# Patient Record
Sex: Male | Born: 1935
Health system: Southern US, Community
[De-identification: ages and names within clinical notes are randomized; demographics above are authoritative.]

## PROBLEM LIST (undated history)

## (undated) DIAGNOSIS — M48061 Spinal stenosis, lumbar region without neurogenic claudication: Secondary | ICD-10-CM

## (undated) DIAGNOSIS — IMO0002 Reserved for concepts with insufficient information to code with codable children: Secondary | ICD-10-CM

## (undated) DIAGNOSIS — R609 Edema, unspecified: Secondary | ICD-10-CM

## (undated) DIAGNOSIS — M47817 Spondylosis without myelopathy or radiculopathy, lumbosacral region: Secondary | ICD-10-CM

## (undated) DIAGNOSIS — H3530 Unspecified macular degeneration: Secondary | ICD-10-CM

## (undated) DIAGNOSIS — E119 Type 2 diabetes mellitus without complications: Secondary | ICD-10-CM

## (undated) DIAGNOSIS — E785 Hyperlipidemia, unspecified: Secondary | ICD-10-CM

## (undated) DIAGNOSIS — R600 Localized edema: Secondary | ICD-10-CM

## (undated) DIAGNOSIS — I639 Cerebral infarction, unspecified: Secondary | ICD-10-CM

## (undated) DIAGNOSIS — R2681 Unsteadiness on feet: Secondary | ICD-10-CM

## (undated) DIAGNOSIS — I10 Essential (primary) hypertension: Secondary | ICD-10-CM

## (undated) DIAGNOSIS — G629 Polyneuropathy, unspecified: Secondary | ICD-10-CM

## (undated) DIAGNOSIS — H409 Unspecified glaucoma: Secondary | ICD-10-CM

## (undated) HISTORY — DX: Unspecified glaucoma: H40.9

## (undated) HISTORY — DX: Edema, unspecified: R60.9

## (undated) HISTORY — DX: Spondylosis without myelopathy or radiculopathy, lumbosacral region: M47.817

## (undated) HISTORY — DX: Type 2 diabetes mellitus without complications: E11.9

## (undated) HISTORY — DX: Essential (primary) hypertension: I10

## (undated) HISTORY — PX: TONSILLECTOMY: SUR1361

## (undated) HISTORY — PX: CATARACT EXTRACTION: SUR2

## (undated) HISTORY — DX: Reserved for concepts with insufficient information to code with codable children: IMO0002

## (undated) HISTORY — DX: Polyneuropathy, unspecified: G62.9

## (undated) HISTORY — PX: HERNIA REPAIR: SHX51

## (undated) HISTORY — DX: Unsteadiness on feet: R26.81

## (undated) HISTORY — DX: Hyperlipidemia, unspecified: E78.5

## (undated) HISTORY — DX: Unspecified macular degeneration: H35.30

## (undated) HISTORY — DX: Spinal stenosis, lumbar region without neurogenic claudication: M48.061

## (undated) HISTORY — PX: CHOLECYSTECTOMY: SHX55

## (undated) HISTORY — DX: Cerebral infarction, unspecified: I63.9

## (undated) HISTORY — DX: Localized edema: R60.0

---

## 2010-11-19 ENCOUNTER — Emergency Department (HOSPITAL_COMMUNITY)
Admission: EM | Admit: 2010-11-19 | Discharge: 2010-11-19 | Disposition: A | Discharge status: Home / Self Care | Payer: Medicare Other | Attending: Emergency Medicine | Admitting: Emergency Medicine

## 2010-11-19 DIAGNOSIS — N3090 Cystitis, unspecified without hematuria: Secondary | ICD-10-CM | POA: Insufficient documentation

## 2010-11-19 DIAGNOSIS — R319 Hematuria, unspecified: Secondary | ICD-10-CM | POA: Insufficient documentation

## 2010-11-19 LAB — URINALYSIS, ROUTINE W REFLEX MICROSCOPIC
Bilirubin Urine: NEGATIVE
Ketones, ur: NEGATIVE mg/dL
Nitrite: NEGATIVE
Protein, ur: NEGATIVE mg/dL
Specific Gravity, Urine: 1.021 (ref 1.005–1.030)
Urobilinogen, UA: 1 mg/dL (ref 0.0–1.0)

## 2010-11-21 LAB — URINE CULTURE
Colony Count: >=100000
Culture  Setup Time: 201203251201

## 2011-10-12 DIAGNOSIS — Z79899 Other long term (current) drug therapy: Secondary | ICD-10-CM | POA: Diagnosis not present

## 2011-10-12 DIAGNOSIS — I6990 Unspecified sequelae of unspecified cerebrovascular disease: Secondary | ICD-10-CM | POA: Diagnosis not present

## 2011-10-12 DIAGNOSIS — I10 Essential (primary) hypertension: Secondary | ICD-10-CM | POA: Diagnosis not present

## 2011-10-12 DIAGNOSIS — G609 Hereditary and idiopathic neuropathy, unspecified: Secondary | ICD-10-CM | POA: Diagnosis not present

## 2011-10-12 DIAGNOSIS — R609 Edema, unspecified: Secondary | ICD-10-CM | POA: Diagnosis not present

## 2011-10-12 DIAGNOSIS — E785 Hyperlipidemia, unspecified: Secondary | ICD-10-CM | POA: Diagnosis not present

## 2011-10-12 DIAGNOSIS — N400 Enlarged prostate without lower urinary tract symptoms: Secondary | ICD-10-CM | POA: Diagnosis not present

## 2011-10-12 DIAGNOSIS — E119 Type 2 diabetes mellitus without complications: Secondary | ICD-10-CM | POA: Diagnosis not present

## 2011-10-18 DIAGNOSIS — I10 Essential (primary) hypertension: Secondary | ICD-10-CM | POA: Diagnosis not present

## 2011-10-18 DIAGNOSIS — R609 Edema, unspecified: Secondary | ICD-10-CM | POA: Diagnosis not present

## 2011-10-31 DIAGNOSIS — R609 Edema, unspecified: Secondary | ICD-10-CM | POA: Diagnosis not present

## 2011-10-31 DIAGNOSIS — E119 Type 2 diabetes mellitus without complications: Secondary | ICD-10-CM | POA: Diagnosis not present

## 2011-10-31 DIAGNOSIS — I10 Essential (primary) hypertension: Secondary | ICD-10-CM | POA: Diagnosis not present

## 2011-10-31 DIAGNOSIS — N400 Enlarged prostate without lower urinary tract symptoms: Secondary | ICD-10-CM | POA: Diagnosis not present

## 2011-10-31 DIAGNOSIS — G609 Hereditary and idiopathic neuropathy, unspecified: Secondary | ICD-10-CM | POA: Diagnosis not present

## 2011-11-07 DIAGNOSIS — H3530 Unspecified macular degeneration: Secondary | ICD-10-CM | POA: Diagnosis not present

## 2011-11-07 DIAGNOSIS — H409 Unspecified glaucoma: Secondary | ICD-10-CM | POA: Diagnosis not present

## 2011-11-07 DIAGNOSIS — Z961 Presence of intraocular lens: Secondary | ICD-10-CM | POA: Diagnosis not present

## 2011-11-07 DIAGNOSIS — H4010X0 Unspecified open-angle glaucoma, stage unspecified: Secondary | ICD-10-CM | POA: Diagnosis not present

## 2011-11-08 DIAGNOSIS — G63 Polyneuropathy in diseases classified elsewhere: Secondary | ICD-10-CM | POA: Diagnosis not present

## 2011-11-08 DIAGNOSIS — R209 Unspecified disturbances of skin sensation: Secondary | ICD-10-CM | POA: Diagnosis not present

## 2011-11-08 DIAGNOSIS — R269 Unspecified abnormalities of gait and mobility: Secondary | ICD-10-CM | POA: Diagnosis not present

## 2011-11-16 DIAGNOSIS — D1801 Hemangioma of skin and subcutaneous tissue: Secondary | ICD-10-CM | POA: Diagnosis not present

## 2011-11-16 DIAGNOSIS — L821 Other seborrheic keratosis: Secondary | ICD-10-CM | POA: Diagnosis not present

## 2011-11-16 DIAGNOSIS — L570 Actinic keratosis: Secondary | ICD-10-CM | POA: Diagnosis not present

## 2011-11-16 DIAGNOSIS — L819 Disorder of pigmentation, unspecified: Secondary | ICD-10-CM | POA: Diagnosis not present

## 2011-12-19 DIAGNOSIS — I6990 Unspecified sequelae of unspecified cerebrovascular disease: Secondary | ICD-10-CM | POA: Diagnosis not present

## 2011-12-19 DIAGNOSIS — I10 Essential (primary) hypertension: Secondary | ICD-10-CM | POA: Diagnosis not present

## 2011-12-19 DIAGNOSIS — E119 Type 2 diabetes mellitus without complications: Secondary | ICD-10-CM | POA: Diagnosis not present

## 2011-12-19 DIAGNOSIS — R609 Edema, unspecified: Secondary | ICD-10-CM | POA: Diagnosis not present

## 2011-12-19 DIAGNOSIS — E785 Hyperlipidemia, unspecified: Secondary | ICD-10-CM | POA: Diagnosis not present

## 2011-12-19 DIAGNOSIS — G609 Hereditary and idiopathic neuropathy, unspecified: Secondary | ICD-10-CM | POA: Diagnosis not present

## 2011-12-19 DIAGNOSIS — Z79899 Other long term (current) drug therapy: Secondary | ICD-10-CM | POA: Diagnosis not present

## 2012-01-17 DIAGNOSIS — N3020 Other chronic cystitis without hematuria: Secondary | ICD-10-CM | POA: Diagnosis not present

## 2012-01-17 DIAGNOSIS — N138 Other obstructive and reflux uropathy: Secondary | ICD-10-CM | POA: Diagnosis not present

## 2012-01-18 DIAGNOSIS — L819 Disorder of pigmentation, unspecified: Secondary | ICD-10-CM | POA: Diagnosis not present

## 2012-01-18 DIAGNOSIS — D1801 Hemangioma of skin and subcutaneous tissue: Secondary | ICD-10-CM | POA: Diagnosis not present

## 2012-01-18 DIAGNOSIS — L821 Other seborrheic keratosis: Secondary | ICD-10-CM | POA: Diagnosis not present

## 2012-01-18 DIAGNOSIS — D235 Other benign neoplasm of skin of trunk: Secondary | ICD-10-CM | POA: Diagnosis not present

## 2012-02-21 DIAGNOSIS — R269 Unspecified abnormalities of gait and mobility: Secondary | ICD-10-CM | POA: Diagnosis not present

## 2012-02-21 DIAGNOSIS — G63 Polyneuropathy in diseases classified elsewhere: Secondary | ICD-10-CM | POA: Diagnosis not present

## 2012-02-21 DIAGNOSIS — R209 Unspecified disturbances of skin sensation: Secondary | ICD-10-CM | POA: Diagnosis not present

## 2012-02-23 ENCOUNTER — Other Ambulatory Visit: Payer: Self-pay | Admitting: Neurology

## 2012-02-23 DIAGNOSIS — R209 Unspecified disturbances of skin sensation: Secondary | ICD-10-CM

## 2012-02-23 DIAGNOSIS — G63 Polyneuropathy in diseases classified elsewhere: Secondary | ICD-10-CM

## 2012-02-23 DIAGNOSIS — R269 Unspecified abnormalities of gait and mobility: Secondary | ICD-10-CM

## 2012-03-04 ENCOUNTER — Ambulatory Visit
Admission: RE | Admit: 2012-03-04 | Discharge: 2012-03-04 | Disposition: A | Discharge status: Home / Self Care | Payer: Medicare Other | Source: Ambulatory Visit | Attending: Neurology | Admitting: Neurology

## 2012-03-04 DIAGNOSIS — G63 Polyneuropathy in diseases classified elsewhere: Secondary | ICD-10-CM

## 2012-03-04 DIAGNOSIS — R209 Unspecified disturbances of skin sensation: Secondary | ICD-10-CM | POA: Diagnosis not present

## 2012-03-04 DIAGNOSIS — R269 Unspecified abnormalities of gait and mobility: Secondary | ICD-10-CM | POA: Diagnosis not present

## 2012-05-21 DIAGNOSIS — H4010X0 Unspecified open-angle glaucoma, stage unspecified: Secondary | ICD-10-CM | POA: Diagnosis not present

## 2012-05-21 DIAGNOSIS — H409 Unspecified glaucoma: Secondary | ICD-10-CM | POA: Diagnosis not present

## 2012-05-21 DIAGNOSIS — Z961 Presence of intraocular lens: Secondary | ICD-10-CM | POA: Diagnosis not present

## 2012-05-23 DIAGNOSIS — Z79899 Other long term (current) drug therapy: Secondary | ICD-10-CM | POA: Diagnosis not present

## 2012-05-23 DIAGNOSIS — E785 Hyperlipidemia, unspecified: Secondary | ICD-10-CM | POA: Diagnosis not present

## 2012-05-23 DIAGNOSIS — R609 Edema, unspecified: Secondary | ICD-10-CM | POA: Diagnosis not present

## 2012-05-23 DIAGNOSIS — G609 Hereditary and idiopathic neuropathy, unspecified: Secondary | ICD-10-CM | POA: Diagnosis not present

## 2012-05-23 DIAGNOSIS — I6990 Unspecified sequelae of unspecified cerebrovascular disease: Secondary | ICD-10-CM | POA: Diagnosis not present

## 2012-05-23 DIAGNOSIS — E119 Type 2 diabetes mellitus without complications: Secondary | ICD-10-CM | POA: Diagnosis not present

## 2012-05-23 DIAGNOSIS — I10 Essential (primary) hypertension: Secondary | ICD-10-CM | POA: Diagnosis not present

## 2012-05-23 DIAGNOSIS — N400 Enlarged prostate without lower urinary tract symptoms: Secondary | ICD-10-CM | POA: Diagnosis not present

## 2012-06-06 DIAGNOSIS — Z23 Encounter for immunization: Secondary | ICD-10-CM | POA: Diagnosis not present

## 2012-06-20 DIAGNOSIS — R609 Edema, unspecified: Secondary | ICD-10-CM | POA: Diagnosis not present

## 2012-06-20 DIAGNOSIS — I10 Essential (primary) hypertension: Secondary | ICD-10-CM | POA: Diagnosis not present

## 2012-06-20 DIAGNOSIS — E785 Hyperlipidemia, unspecified: Secondary | ICD-10-CM | POA: Diagnosis not present

## 2012-06-21 DIAGNOSIS — R269 Unspecified abnormalities of gait and mobility: Secondary | ICD-10-CM | POA: Diagnosis not present

## 2012-06-21 DIAGNOSIS — G63 Polyneuropathy in diseases classified elsewhere: Secondary | ICD-10-CM | POA: Diagnosis not present

## 2012-06-21 DIAGNOSIS — R209 Unspecified disturbances of skin sensation: Secondary | ICD-10-CM | POA: Diagnosis not present

## 2012-07-18 DIAGNOSIS — N323 Diverticulum of bladder: Secondary | ICD-10-CM | POA: Diagnosis not present

## 2012-07-18 DIAGNOSIS — N3020 Other chronic cystitis without hematuria: Secondary | ICD-10-CM | POA: Diagnosis not present

## 2012-07-18 DIAGNOSIS — N403 Nodular prostate with lower urinary tract symptoms: Secondary | ICD-10-CM | POA: Diagnosis not present

## 2012-07-18 DIAGNOSIS — N138 Other obstructive and reflux uropathy: Secondary | ICD-10-CM | POA: Diagnosis not present

## 2012-07-31 DIAGNOSIS — N183 Chronic kidney disease, stage 3 unspecified: Secondary | ICD-10-CM | POA: Diagnosis not present

## 2012-07-31 DIAGNOSIS — D638 Anemia in other chronic diseases classified elsewhere: Secondary | ICD-10-CM | POA: Diagnosis not present

## 2012-08-20 DIAGNOSIS — Z96659 Presence of unspecified artificial knee joint: Secondary | ICD-10-CM | POA: Diagnosis not present

## 2012-08-20 DIAGNOSIS — I129 Hypertensive chronic kidney disease with stage 1 through stage 4 chronic kidney disease, or unspecified chronic kidney disease: Secondary | ICD-10-CM | POA: Diagnosis present

## 2012-08-20 DIAGNOSIS — I739 Peripheral vascular disease, unspecified: Secondary | ICD-10-CM | POA: Diagnosis present

## 2012-08-20 DIAGNOSIS — I498 Other specified cardiac arrhythmias: Secondary | ICD-10-CM | POA: Diagnosis present

## 2012-08-20 DIAGNOSIS — J189 Pneumonia, unspecified organism: Secondary | ICD-10-CM | POA: Diagnosis present

## 2012-08-20 DIAGNOSIS — N189 Chronic kidney disease, unspecified: Secondary | ICD-10-CM | POA: Diagnosis present

## 2012-08-20 DIAGNOSIS — E119 Type 2 diabetes mellitus without complications: Secondary | ICD-10-CM | POA: Diagnosis present

## 2012-08-20 DIAGNOSIS — N179 Acute kidney failure, unspecified: Secondary | ICD-10-CM | POA: Diagnosis present

## 2012-08-20 DIAGNOSIS — I214 Non-ST elevation (NSTEMI) myocardial infarction: Secondary | ICD-10-CM | POA: Diagnosis present

## 2012-08-20 DIAGNOSIS — J9600 Acute respiratory failure, unspecified whether with hypoxia or hypercapnia: Secondary | ICD-10-CM | POA: Diagnosis present

## 2012-08-20 DIAGNOSIS — I2510 Atherosclerotic heart disease of native coronary artery without angina pectoris: Secondary | ICD-10-CM | POA: Diagnosis present

## 2012-08-20 DIAGNOSIS — N390 Urinary tract infection, site not specified: Secondary | ICD-10-CM | POA: Diagnosis present

## 2012-08-20 DIAGNOSIS — Z79899 Other long term (current) drug therapy: Secondary | ICD-10-CM | POA: Diagnosis not present

## 2012-08-20 DIAGNOSIS — E871 Hypo-osmolality and hyponatremia: Secondary | ICD-10-CM | POA: Diagnosis present

## 2012-08-20 DIAGNOSIS — A498 Other bacterial infections of unspecified site: Secondary | ICD-10-CM | POA: Diagnosis present

## 2012-08-20 DIAGNOSIS — I509 Heart failure, unspecified: Secondary | ICD-10-CM | POA: Diagnosis present

## 2012-08-20 DIAGNOSIS — I5031 Acute diastolic (congestive) heart failure: Secondary | ICD-10-CM | POA: Diagnosis present

## 2012-09-03 DIAGNOSIS — D638 Anemia in other chronic diseases classified elsewhere: Secondary | ICD-10-CM | POA: Diagnosis not present

## 2012-09-03 DIAGNOSIS — N183 Chronic kidney disease, stage 3 unspecified: Secondary | ICD-10-CM | POA: Diagnosis not present

## 2012-10-01 DIAGNOSIS — N183 Chronic kidney disease, stage 3 unspecified: Secondary | ICD-10-CM | POA: Diagnosis not present

## 2012-10-01 DIAGNOSIS — D638 Anemia in other chronic diseases classified elsewhere: Secondary | ICD-10-CM | POA: Diagnosis not present

## 2012-10-08 DIAGNOSIS — D638 Anemia in other chronic diseases classified elsewhere: Secondary | ICD-10-CM | POA: Diagnosis not present

## 2012-10-08 DIAGNOSIS — N183 Chronic kidney disease, stage 3 unspecified: Secondary | ICD-10-CM | POA: Diagnosis not present

## 2012-10-16 DIAGNOSIS — N183 Chronic kidney disease, stage 3 unspecified: Secondary | ICD-10-CM | POA: Diagnosis not present

## 2012-10-16 DIAGNOSIS — D638 Anemia in other chronic diseases classified elsewhere: Secondary | ICD-10-CM | POA: Diagnosis not present

## 2012-10-17 DIAGNOSIS — E119 Type 2 diabetes mellitus without complications: Secondary | ICD-10-CM | POA: Diagnosis not present

## 2012-10-17 DIAGNOSIS — I10 Essential (primary) hypertension: Secondary | ICD-10-CM | POA: Diagnosis not present

## 2012-10-17 DIAGNOSIS — R609 Edema, unspecified: Secondary | ICD-10-CM | POA: Diagnosis not present

## 2012-10-17 DIAGNOSIS — E785 Hyperlipidemia, unspecified: Secondary | ICD-10-CM | POA: Diagnosis not present

## 2012-10-17 DIAGNOSIS — G609 Hereditary and idiopathic neuropathy, unspecified: Secondary | ICD-10-CM | POA: Diagnosis not present

## 2012-10-17 DIAGNOSIS — N400 Enlarged prostate without lower urinary tract symptoms: Secondary | ICD-10-CM | POA: Diagnosis not present

## 2012-10-17 DIAGNOSIS — Z79899 Other long term (current) drug therapy: Secondary | ICD-10-CM | POA: Diagnosis not present

## 2012-10-17 DIAGNOSIS — I6990 Unspecified sequelae of unspecified cerebrovascular disease: Secondary | ICD-10-CM | POA: Diagnosis not present

## 2012-10-22 DIAGNOSIS — R269 Unspecified abnormalities of gait and mobility: Secondary | ICD-10-CM | POA: Diagnosis not present

## 2012-10-22 DIAGNOSIS — G63 Polyneuropathy in diseases classified elsewhere: Secondary | ICD-10-CM | POA: Diagnosis not present

## 2012-10-22 DIAGNOSIS — R209 Unspecified disturbances of skin sensation: Secondary | ICD-10-CM | POA: Diagnosis not present

## 2012-10-23 DIAGNOSIS — M5137 Other intervertebral disc degeneration, lumbosacral region: Secondary | ICD-10-CM | POA: Diagnosis not present

## 2012-10-23 DIAGNOSIS — M25559 Pain in unspecified hip: Secondary | ICD-10-CM | POA: Diagnosis not present

## 2012-10-23 DIAGNOSIS — M999 Biomechanical lesion, unspecified: Secondary | ICD-10-CM | POA: Diagnosis not present

## 2012-10-24 DIAGNOSIS — M5137 Other intervertebral disc degeneration, lumbosacral region: Secondary | ICD-10-CM | POA: Diagnosis not present

## 2012-10-24 DIAGNOSIS — M999 Biomechanical lesion, unspecified: Secondary | ICD-10-CM | POA: Diagnosis not present

## 2012-10-24 DIAGNOSIS — IMO0002 Reserved for concepts with insufficient information to code with codable children: Secondary | ICD-10-CM | POA: Diagnosis not present

## 2012-10-28 DIAGNOSIS — IMO0002 Reserved for concepts with insufficient information to code with codable children: Secondary | ICD-10-CM | POA: Diagnosis not present

## 2012-10-28 DIAGNOSIS — M5137 Other intervertebral disc degeneration, lumbosacral region: Secondary | ICD-10-CM | POA: Diagnosis not present

## 2012-10-28 DIAGNOSIS — M999 Biomechanical lesion, unspecified: Secondary | ICD-10-CM | POA: Diagnosis not present

## 2012-10-30 DIAGNOSIS — N183 Chronic kidney disease, stage 3 unspecified: Secondary | ICD-10-CM | POA: Diagnosis not present

## 2012-10-30 DIAGNOSIS — D638 Anemia in other chronic diseases classified elsewhere: Secondary | ICD-10-CM | POA: Diagnosis not present

## 2012-11-26 DIAGNOSIS — N183 Chronic kidney disease, stage 3 unspecified: Secondary | ICD-10-CM | POA: Diagnosis not present

## 2012-11-26 DIAGNOSIS — D638 Anemia in other chronic diseases classified elsewhere: Secondary | ICD-10-CM | POA: Diagnosis not present

## 2012-12-10 DIAGNOSIS — E785 Hyperlipidemia, unspecified: Secondary | ICD-10-CM | POA: Diagnosis not present

## 2012-12-25 DIAGNOSIS — Z79899 Other long term (current) drug therapy: Secondary | ICD-10-CM | POA: Diagnosis not present

## 2012-12-25 DIAGNOSIS — I6990 Unspecified sequelae of unspecified cerebrovascular disease: Secondary | ICD-10-CM | POA: Diagnosis not present

## 2012-12-25 DIAGNOSIS — Z9861 Coronary angioplasty status: Secondary | ICD-10-CM | POA: Diagnosis not present

## 2012-12-25 DIAGNOSIS — Z8701 Personal history of pneumonia (recurrent): Secondary | ICD-10-CM | POA: Diagnosis not present

## 2012-12-25 DIAGNOSIS — I739 Peripheral vascular disease, unspecified: Secondary | ICD-10-CM | POA: Diagnosis not present

## 2012-12-25 DIAGNOSIS — I10 Essential (primary) hypertension: Secondary | ICD-10-CM | POA: Diagnosis not present

## 2012-12-25 DIAGNOSIS — E785 Hyperlipidemia, unspecified: Secondary | ICD-10-CM | POA: Diagnosis not present

## 2012-12-25 DIAGNOSIS — Z8719 Personal history of other diseases of the digestive system: Secondary | ICD-10-CM | POA: Diagnosis not present

## 2012-12-25 DIAGNOSIS — Z7982 Long term (current) use of aspirin: Secondary | ICD-10-CM | POA: Diagnosis not present

## 2012-12-25 DIAGNOSIS — R42 Dizziness and giddiness: Secondary | ICD-10-CM | POA: Diagnosis not present

## 2012-12-25 DIAGNOSIS — E119 Type 2 diabetes mellitus without complications: Secondary | ICD-10-CM | POA: Diagnosis not present

## 2012-12-25 DIAGNOSIS — Z8679 Personal history of other diseases of the circulatory system: Secondary | ICD-10-CM | POA: Diagnosis not present

## 2013-01-01 DIAGNOSIS — D638 Anemia in other chronic diseases classified elsewhere: Secondary | ICD-10-CM | POA: Diagnosis not present

## 2013-01-01 DIAGNOSIS — N183 Chronic kidney disease, stage 3 unspecified: Secondary | ICD-10-CM | POA: Diagnosis not present

## 2013-01-15 DIAGNOSIS — N183 Chronic kidney disease, stage 3 unspecified: Secondary | ICD-10-CM | POA: Diagnosis not present

## 2013-01-15 DIAGNOSIS — D638 Anemia in other chronic diseases classified elsewhere: Secondary | ICD-10-CM | POA: Diagnosis not present

## 2013-01-16 DIAGNOSIS — D1801 Hemangioma of skin and subcutaneous tissue: Secondary | ICD-10-CM | POA: Diagnosis not present

## 2013-01-16 DIAGNOSIS — D485 Neoplasm of uncertain behavior of skin: Secondary | ICD-10-CM | POA: Diagnosis not present

## 2013-01-16 DIAGNOSIS — L570 Actinic keratosis: Secondary | ICD-10-CM | POA: Diagnosis not present

## 2013-01-16 DIAGNOSIS — L821 Other seborrheic keratosis: Secondary | ICD-10-CM | POA: Diagnosis not present

## 2013-01-22 DIAGNOSIS — N183 Chronic kidney disease, stage 3 unspecified: Secondary | ICD-10-CM | POA: Diagnosis not present

## 2013-01-22 DIAGNOSIS — D638 Anemia in other chronic diseases classified elsewhere: Secondary | ICD-10-CM | POA: Diagnosis not present

## 2013-01-29 DIAGNOSIS — N183 Chronic kidney disease, stage 3 unspecified: Secondary | ICD-10-CM | POA: Diagnosis not present

## 2013-01-29 DIAGNOSIS — D638 Anemia in other chronic diseases classified elsewhere: Secondary | ICD-10-CM | POA: Diagnosis not present

## 2013-02-20 DIAGNOSIS — L905 Scar conditions and fibrosis of skin: Secondary | ICD-10-CM | POA: Diagnosis not present

## 2013-02-20 DIAGNOSIS — D485 Neoplasm of uncertain behavior of skin: Secondary | ICD-10-CM | POA: Diagnosis not present

## 2013-02-25 DIAGNOSIS — H409 Unspecified glaucoma: Secondary | ICD-10-CM | POA: Diagnosis not present

## 2013-02-25 DIAGNOSIS — H4010X0 Unspecified open-angle glaucoma, stage unspecified: Secondary | ICD-10-CM | POA: Diagnosis not present

## 2013-02-25 DIAGNOSIS — H04129 Dry eye syndrome of unspecified lacrimal gland: Secondary | ICD-10-CM | POA: Diagnosis not present

## 2013-02-25 DIAGNOSIS — H3530 Unspecified macular degeneration: Secondary | ICD-10-CM | POA: Diagnosis not present

## 2013-02-26 DIAGNOSIS — N183 Chronic kidney disease, stage 3 unspecified: Secondary | ICD-10-CM | POA: Diagnosis not present

## 2013-02-26 DIAGNOSIS — D638 Anemia in other chronic diseases classified elsewhere: Secondary | ICD-10-CM | POA: Diagnosis not present

## 2013-03-21 ENCOUNTER — Ambulatory Visit: Payer: Self-pay | Admitting: Neurology

## 2013-04-17 DIAGNOSIS — I10 Essential (primary) hypertension: Secondary | ICD-10-CM | POA: Diagnosis not present

## 2013-04-17 DIAGNOSIS — Z79899 Other long term (current) drug therapy: Secondary | ICD-10-CM | POA: Diagnosis not present

## 2013-04-17 DIAGNOSIS — E119 Type 2 diabetes mellitus without complications: Secondary | ICD-10-CM | POA: Diagnosis not present

## 2013-04-17 DIAGNOSIS — R269 Unspecified abnormalities of gait and mobility: Secondary | ICD-10-CM | POA: Diagnosis not present

## 2013-04-17 DIAGNOSIS — N400 Enlarged prostate without lower urinary tract symptoms: Secondary | ICD-10-CM | POA: Diagnosis not present

## 2013-04-17 DIAGNOSIS — R0609 Other forms of dyspnea: Secondary | ICD-10-CM | POA: Diagnosis not present

## 2013-04-17 DIAGNOSIS — G609 Hereditary and idiopathic neuropathy, unspecified: Secondary | ICD-10-CM | POA: Diagnosis not present

## 2013-04-17 DIAGNOSIS — R609 Edema, unspecified: Secondary | ICD-10-CM | POA: Diagnosis not present

## 2013-05-05 DIAGNOSIS — R29898 Other symptoms and signs involving the musculoskeletal system: Secondary | ICD-10-CM | POA: Diagnosis not present

## 2013-05-05 DIAGNOSIS — G609 Hereditary and idiopathic neuropathy, unspecified: Secondary | ICD-10-CM | POA: Diagnosis not present

## 2013-05-08 ENCOUNTER — Ambulatory Visit: Payer: Medicare Other | Attending: Family Medicine

## 2013-05-08 DIAGNOSIS — IMO0001 Reserved for inherently not codable concepts without codable children: Secondary | ICD-10-CM | POA: Insufficient documentation

## 2013-05-08 DIAGNOSIS — R262 Difficulty in walking, not elsewhere classified: Secondary | ICD-10-CM | POA: Diagnosis not present

## 2013-05-08 DIAGNOSIS — R5381 Other malaise: Secondary | ICD-10-CM | POA: Diagnosis not present

## 2013-05-08 DIAGNOSIS — M6281 Muscle weakness (generalized): Secondary | ICD-10-CM | POA: Insufficient documentation

## 2013-05-09 ENCOUNTER — Ambulatory Visit: Payer: Medicare Other | Admitting: Physical Therapy

## 2013-05-13 ENCOUNTER — Ambulatory Visit
Admission: RE | Admit: 2013-05-13 | Discharge: 2013-05-13 | Disposition: A | Discharge status: Home / Self Care | Payer: Medicare Other | Source: Ambulatory Visit | Attending: Family Medicine | Admitting: Family Medicine

## 2013-05-13 ENCOUNTER — Other Ambulatory Visit: Payer: Self-pay | Admitting: Lactation Services

## 2013-05-13 ENCOUNTER — Other Ambulatory Visit: Payer: Self-pay | Admitting: Family Medicine

## 2013-05-13 DIAGNOSIS — R609 Edema, unspecified: Secondary | ICD-10-CM | POA: Diagnosis not present

## 2013-05-13 DIAGNOSIS — S298XXA Other specified injuries of thorax, initial encounter: Secondary | ICD-10-CM | POA: Diagnosis not present

## 2013-05-13 DIAGNOSIS — R0989 Other specified symptoms and signs involving the circulatory and respiratory systems: Secondary | ICD-10-CM

## 2013-05-13 DIAGNOSIS — R059 Cough, unspecified: Secondary | ICD-10-CM | POA: Diagnosis not present

## 2013-05-13 DIAGNOSIS — I10 Essential (primary) hypertension: Secondary | ICD-10-CM | POA: Diagnosis not present

## 2013-05-13 DIAGNOSIS — R32 Unspecified urinary incontinence: Secondary | ICD-10-CM | POA: Diagnosis not present

## 2013-05-13 DIAGNOSIS — R269 Unspecified abnormalities of gait and mobility: Secondary | ICD-10-CM | POA: Diagnosis not present

## 2013-05-13 DIAGNOSIS — R0609 Other forms of dyspnea: Secondary | ICD-10-CM | POA: Diagnosis not present

## 2013-05-13 DIAGNOSIS — Z9181 History of falling: Secondary | ICD-10-CM | POA: Diagnosis not present

## 2013-05-13 DIAGNOSIS — G609 Hereditary and idiopathic neuropathy, unspecified: Secondary | ICD-10-CM | POA: Diagnosis not present

## 2013-05-13 DIAGNOSIS — I6990 Unspecified sequelae of unspecified cerebrovascular disease: Secondary | ICD-10-CM | POA: Diagnosis not present

## 2013-05-13 DIAGNOSIS — R05 Cough: Secondary | ICD-10-CM | POA: Diagnosis not present

## 2013-05-14 ENCOUNTER — Ambulatory Visit: Payer: Medicare Other | Admitting: Physical Therapy

## 2013-05-15 ENCOUNTER — Ambulatory Visit: Payer: Medicare Other

## 2013-05-19 ENCOUNTER — Ambulatory Visit: Payer: Medicare Other

## 2013-05-21 DIAGNOSIS — Z23 Encounter for immunization: Secondary | ICD-10-CM | POA: Diagnosis not present

## 2013-05-22 ENCOUNTER — Ambulatory Visit: Payer: Medicare Other

## 2013-05-27 ENCOUNTER — Ambulatory Visit: Payer: Medicare Other

## 2013-05-28 DIAGNOSIS — D649 Anemia, unspecified: Secondary | ICD-10-CM | POA: Diagnosis not present

## 2013-05-29 ENCOUNTER — Ambulatory Visit: Payer: Medicare Other | Attending: Family Medicine | Admitting: Physical Therapy

## 2013-05-29 DIAGNOSIS — R262 Difficulty in walking, not elsewhere classified: Secondary | ICD-10-CM | POA: Insufficient documentation

## 2013-05-29 DIAGNOSIS — IMO0001 Reserved for inherently not codable concepts without codable children: Secondary | ICD-10-CM | POA: Insufficient documentation

## 2013-05-29 DIAGNOSIS — M6281 Muscle weakness (generalized): Secondary | ICD-10-CM | POA: Diagnosis not present

## 2013-05-29 DIAGNOSIS — R5381 Other malaise: Secondary | ICD-10-CM | POA: Insufficient documentation

## 2013-05-30 ENCOUNTER — Other Ambulatory Visit: Payer: Self-pay | Admitting: Neurology

## 2013-06-03 ENCOUNTER — Ambulatory Visit: Payer: Medicare Other | Admitting: Physical Therapy

## 2013-06-05 ENCOUNTER — Ambulatory Visit: Payer: Medicare Other

## 2013-06-10 ENCOUNTER — Ambulatory Visit: Payer: Medicare Other

## 2013-06-12 ENCOUNTER — Ambulatory Visit: Payer: Medicare Other | Admitting: Physical Therapy

## 2013-06-17 ENCOUNTER — Ambulatory Visit: Payer: Medicare Other

## 2013-06-19 ENCOUNTER — Ambulatory Visit: Payer: Medicare Other | Admitting: Physical Therapy

## 2013-06-22 ENCOUNTER — Encounter: Payer: Self-pay | Admitting: *Deleted

## 2013-06-22 ENCOUNTER — Encounter: Payer: Self-pay | Admitting: Cardiology

## 2013-06-22 DIAGNOSIS — IMO0002 Reserved for concepts with insufficient information to code with codable children: Secondary | ICD-10-CM | POA: Insufficient documentation

## 2013-06-22 DIAGNOSIS — R2681 Unsteadiness on feet: Secondary | ICD-10-CM | POA: Insufficient documentation

## 2013-06-22 DIAGNOSIS — R609 Edema, unspecified: Secondary | ICD-10-CM | POA: Insufficient documentation

## 2013-06-22 DIAGNOSIS — E785 Hyperlipidemia, unspecified: Secondary | ICD-10-CM | POA: Insufficient documentation

## 2013-06-22 DIAGNOSIS — I10 Essential (primary) hypertension: Secondary | ICD-10-CM | POA: Insufficient documentation

## 2013-06-22 DIAGNOSIS — E119 Type 2 diabetes mellitus without complications: Secondary | ICD-10-CM | POA: Insufficient documentation

## 2013-06-22 DIAGNOSIS — G629 Polyneuropathy, unspecified: Secondary | ICD-10-CM | POA: Insufficient documentation

## 2013-06-22 DIAGNOSIS — I639 Cerebral infarction, unspecified: Secondary | ICD-10-CM | POA: Insufficient documentation

## 2013-06-23 ENCOUNTER — Encounter (INDEPENDENT_AMBULATORY_CARE_PROVIDER_SITE_OTHER): Payer: Self-pay

## 2013-06-23 ENCOUNTER — Encounter: Payer: Self-pay | Admitting: Cardiology

## 2013-06-23 ENCOUNTER — Ambulatory Visit (INDEPENDENT_AMBULATORY_CARE_PROVIDER_SITE_OTHER): Payer: Medicare Other | Admitting: Cardiology

## 2013-06-23 VITALS — BP 146/73 | HR 73 | Ht 71.0 in | Wt 227.0 lb

## 2013-06-23 DIAGNOSIS — I10 Essential (primary) hypertension: Secondary | ICD-10-CM

## 2013-06-23 DIAGNOSIS — E785 Hyperlipidemia, unspecified: Secondary | ICD-10-CM

## 2013-06-23 DIAGNOSIS — I639 Cerebral infarction, unspecified: Secondary | ICD-10-CM

## 2013-06-23 DIAGNOSIS — I6350 Cerebral infarction due to unspecified occlusion or stenosis of unspecified cerebral artery: Secondary | ICD-10-CM | POA: Diagnosis not present

## 2013-06-23 DIAGNOSIS — R269 Unspecified abnormalities of gait and mobility: Secondary | ICD-10-CM | POA: Diagnosis not present

## 2013-06-23 DIAGNOSIS — R2681 Unsteadiness on feet: Secondary | ICD-10-CM

## 2013-06-23 NOTE — Progress Notes (Signed)
[address], [address] [place], [place]  [address] Phone: [phone] Fax:  [phone]  Date:  06/23/2013   ID:  Timothy Cooper, DOB 04/27/1924, MRN [id number]  PCP:  Darrow Bussing, MD   History of Present Illness: Timothy Cooper is a 79 y.o. male with history of stroke, hypertension, hyperlipidemia here for followup.  Echocardiogram 12/11 with normal ejection fraction, mild MAC, mild mitral regurgitation. Nuclear stress test no evidence of ischemia, normal EF Holter monitor-10 beat irregular wide-complex tachycardia per report. No syncope. On metoprolol.   Diabetes, LDL 47, creatinine 1.2.   03/47 - moved to [place]. Raynelle Fanning has triplets. [place] house. Main complaints is neuropathy. Trying NTG left foot.    Wt Readings from Last 3 Encounters:  06/23/13 227 lb (102.967 kg)     Past Medical History  Diagnosis Date  . Diabetes   . 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 Anne Fu  . Hyperlipidemia   . HTN (hypertension)   . Edema   . Neuropathy      feet, hands since 1992- seen neuro in past- idiopathic per prior work up- now sees Dr. Anne Hahn- failed Cymbalta  . Gait instability   . Peripheral edema   . DDD (degenerative disc disease)      of spine on CT 03/2011----moderate SS at L3-4-5, multilevel foraminal narrowing    Past Surgical History  Procedure Laterality Date  . Hernia repair    . Cholecystectomy      Current Outpatient Prescriptions  Medication Sig Dispense Refill  . aspirin 81 MG tablet Take 81 mg by mouth daily.      Marland Kitchen atorvastatin (LIPITOR) 10 MG tablet Take 10 mg by mouth daily.       . enalapril (VASOTEC) 10 MG tablet Take 10 mg by mouth daily.      . finasteride (PROSCAR) 5 MG tablet Take 20 mg by mouth daily.       . furosemide (LASIX) 20 MG tablet Take 20 mg by mouth.      . gabapentin (NEURONTIN) 800 MG tablet Take 800 mg by mouth 4 (four) times daily.      Marland Kitchen latanoprost (XALATAN) 0.005 % ophthalmic  solution Place 1 drop into both eyes at bedtime.      . metoprolol succinate (TOPROL XL) 50 MG 24 hr tablet Take 50 mg by mouth daily. Take with or immediately following a meal.      . Multiple Vitamin (MULTIVITAMIN) capsule Take 1 capsule by mouth daily.      . nitroGLYCERIN (NITROSTAT) 0.4 MG SL tablet Place 0.4 mg under the tongue every 5 (five) minutes as needed for chest pain.      . nortriptyline (PAMELOR) 25 MG capsule TAKE 3 CAPSULES BY MOUTH EVERY EVENING  270 capsule  0  . Omega-3 Fatty Acids (FISH OIL PO) Take 1,400 mg by mouth.      . tamsulosin (FLOMAX) 0.4 MG CAPS capsule        No current facility-administered medications for this visit.    Allergies:   Not on File  Social History:  The patient  reports that he has never smoked. He does not have any smokeless tobacco history on file.   ROS:  Please see the history of present illness.   No syncope, no bleeding, no new strokelike symptoms, no chest pain    PHYSICAL EXAM: VS:  BP 146/73  Pulse 73  Ht 5\' 11"  (1.803 m)  Wt 227 lb (102.967 kg)  BMI 31.67 kg/m2 Well nourished, well developed, in no acute distress HEENT: normal Neck: no JVD Cardiac:  normal S1, S2; RRR; no murmur Lungs:  clear to auscultation bilaterally, no wheezing, rhonchi or rales Abd: soft, nontender, no hepatomegaly Ext: no edema Skin: warm and dry Neuro: no focal abnormalities noted  EKG:  Sinus rhythm, 88 with no other changes     ASSESSMENT AND PLAN:  1. Hypertension -medications reviewed. Doing very well. No changes made today. Continue with low-sodium diet. 2. Prior stroke 3. Gait instability-cane 4. Hyperlipidemia-secondary prevention.   Signed, Donato Schultz, MD [facility]  06/23/2013 2:13 PM

## 2013-06-23 NOTE — Patient Instructions (Signed)
Your physician wants you to follow-up in: 1 year with Dr. Skains You will receive a reminder letter in the mail two months in advance. If you don't receive a letter, please call our office to schedule the follow-up appointment.  Your physician recommends that you continue on your current medications as directed. Please refer to the Current Medication list given to you today.  

## 2013-06-24 ENCOUNTER — Ambulatory Visit: Payer: Medicare Other

## 2013-06-26 ENCOUNTER — Ambulatory Visit: Payer: Medicare Other | Admitting: Physical Therapy

## 2013-07-01 ENCOUNTER — Ambulatory Visit: Payer: Medicare Other | Attending: Family Medicine

## 2013-07-01 DIAGNOSIS — IMO0001 Reserved for inherently not codable concepts without codable children: Secondary | ICD-10-CM | POA: Diagnosis not present

## 2013-07-01 DIAGNOSIS — M6281 Muscle weakness (generalized): Secondary | ICD-10-CM | POA: Insufficient documentation

## 2013-07-01 DIAGNOSIS — R262 Difficulty in walking, not elsewhere classified: Secondary | ICD-10-CM | POA: Insufficient documentation

## 2013-07-01 DIAGNOSIS — R5381 Other malaise: Secondary | ICD-10-CM | POA: Insufficient documentation

## 2013-07-03 ENCOUNTER — Ambulatory Visit: Payer: Medicare Other | Admitting: Physical Therapy

## 2013-07-03 DIAGNOSIS — R262 Difficulty in walking, not elsewhere classified: Secondary | ICD-10-CM | POA: Diagnosis not present

## 2013-07-03 DIAGNOSIS — M6281 Muscle weakness (generalized): Secondary | ICD-10-CM | POA: Diagnosis not present

## 2013-07-03 DIAGNOSIS — IMO0001 Reserved for inherently not codable concepts without codable children: Secondary | ICD-10-CM | POA: Diagnosis not present

## 2013-07-03 DIAGNOSIS — R5381 Other malaise: Secondary | ICD-10-CM | POA: Diagnosis not present

## 2013-07-08 ENCOUNTER — Ambulatory Visit: Payer: Medicare Other | Admitting: Physical Therapy

## 2013-07-08 DIAGNOSIS — R5381 Other malaise: Secondary | ICD-10-CM | POA: Diagnosis not present

## 2013-07-08 DIAGNOSIS — M6281 Muscle weakness (generalized): Secondary | ICD-10-CM | POA: Diagnosis not present

## 2013-07-08 DIAGNOSIS — IMO0001 Reserved for inherently not codable concepts without codable children: Secondary | ICD-10-CM | POA: Diagnosis not present

## 2013-07-08 DIAGNOSIS — R262 Difficulty in walking, not elsewhere classified: Secondary | ICD-10-CM | POA: Diagnosis not present

## 2013-07-10 ENCOUNTER — Ambulatory Visit: Payer: Medicare Other | Admitting: Physical Therapy

## 2013-07-10 DIAGNOSIS — IMO0001 Reserved for inherently not codable concepts without codable children: Secondary | ICD-10-CM | POA: Diagnosis not present

## 2013-07-10 DIAGNOSIS — R5381 Other malaise: Secondary | ICD-10-CM | POA: Diagnosis not present

## 2013-07-10 DIAGNOSIS — R262 Difficulty in walking, not elsewhere classified: Secondary | ICD-10-CM | POA: Diagnosis not present

## 2013-07-10 DIAGNOSIS — M6281 Muscle weakness (generalized): Secondary | ICD-10-CM | POA: Diagnosis not present

## 2013-07-15 ENCOUNTER — Ambulatory Visit: Payer: Medicare Other

## 2013-07-17 ENCOUNTER — Encounter: Payer: Medicare Other | Admitting: Physical Therapy

## 2013-07-22 ENCOUNTER — Ambulatory Visit: Payer: Medicare Other

## 2013-07-29 ENCOUNTER — Other Ambulatory Visit: Payer: Self-pay | Admitting: Neurology

## 2013-08-25 DIAGNOSIS — R609 Edema, unspecified: Secondary | ICD-10-CM | POA: Diagnosis not present

## 2013-08-25 DIAGNOSIS — I6990 Unspecified sequelae of unspecified cerebrovascular disease: Secondary | ICD-10-CM | POA: Diagnosis not present

## 2013-08-25 DIAGNOSIS — E119 Type 2 diabetes mellitus without complications: Secondary | ICD-10-CM | POA: Diagnosis not present

## 2013-08-25 DIAGNOSIS — N400 Enlarged prostate without lower urinary tract symptoms: Secondary | ICD-10-CM | POA: Diagnosis not present

## 2013-08-25 DIAGNOSIS — E785 Hyperlipidemia, unspecified: Secondary | ICD-10-CM | POA: Diagnosis not present

## 2013-08-25 DIAGNOSIS — R269 Unspecified abnormalities of gait and mobility: Secondary | ICD-10-CM | POA: Diagnosis not present

## 2013-08-25 DIAGNOSIS — I10 Essential (primary) hypertension: Secondary | ICD-10-CM | POA: Diagnosis not present

## 2013-08-25 DIAGNOSIS — G609 Hereditary and idiopathic neuropathy, unspecified: Secondary | ICD-10-CM | POA: Diagnosis not present

## 2013-09-09 DIAGNOSIS — H4010X0 Unspecified open-angle glaucoma, stage unspecified: Secondary | ICD-10-CM | POA: Diagnosis not present

## 2013-09-09 DIAGNOSIS — H18419 Arcus senilis, unspecified eye: Secondary | ICD-10-CM | POA: Diagnosis not present

## 2013-09-09 DIAGNOSIS — H409 Unspecified glaucoma: Secondary | ICD-10-CM | POA: Diagnosis not present

## 2013-09-09 DIAGNOSIS — H2510 Age-related nuclear cataract, unspecified eye: Secondary | ICD-10-CM | POA: Diagnosis not present

## 2013-10-06 ENCOUNTER — Encounter: Payer: Self-pay | Admitting: Neurology

## 2013-10-09 ENCOUNTER — Encounter (INDEPENDENT_AMBULATORY_CARE_PROVIDER_SITE_OTHER): Payer: Self-pay

## 2013-10-09 ENCOUNTER — Ambulatory Visit (INDEPENDENT_AMBULATORY_CARE_PROVIDER_SITE_OTHER): Payer: Medicare Other | Admitting: Neurology

## 2013-10-09 ENCOUNTER — Encounter: Payer: Self-pay | Admitting: Neurology

## 2013-10-09 VITALS — BP 146/66 | HR 48 | Wt 228.0 lb

## 2013-10-09 DIAGNOSIS — R2681 Unsteadiness on feet: Secondary | ICD-10-CM

## 2013-10-09 DIAGNOSIS — R269 Unspecified abnormalities of gait and mobility: Secondary | ICD-10-CM | POA: Diagnosis not present

## 2013-10-09 DIAGNOSIS — G629 Polyneuropathy, unspecified: Secondary | ICD-10-CM

## 2013-10-09 DIAGNOSIS — G589 Mononeuropathy, unspecified: Secondary | ICD-10-CM | POA: Diagnosis not present

## 2013-10-09 MED ORDER — NORTRIPTYLINE HCL 25 MG PO CAPS: 75.0000 mg | ORAL_CAPSULE | Freq: Every day | ORAL | Status: DC

## 2013-10-09 NOTE — Patient Instructions (Signed)

## 2013-10-09 NOTE — Progress Notes (Signed)
Reason for visit: Peripheral neuropathy  Timothy Cooper is an 80 y.o. male  History of present illness:  Timothy Cooper is an 80 year old right-handed white male with a history of a peripheral neuropathy and a history of lumbosacral spinal stenosis. The patient has a gait disorder associated with the neuropathy, and he has fallen on 3 occasions since last seen one year ago. The patient indicates that the last fall was in September 2014. The patient uses a cane for ambulation. The patient has discomfort that is worse at night, and every 2 weeks or so, he is unable to sleep because of the discomfort. The patient indicates that he uses nitro paste which helps his neuropathy pain. The patient is on nortriptyline at night taking 75 mg. The patient also takes gabapentin. The patient returns to this office for an evaluation.  Past Medical History  Diagnosis Date  . Diabetes   . 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 Marlou Porch  . Hyperlipidemia   . HTN (hypertension)   . Edema   . Neuropathy      feet, hands since 1992- seen neuro in past- idiopathic per prior work up- now sees Dr. Jannifer Franklin- failed Cymbalta  . Gait instability   . Peripheral edema   . DDD (degenerative disc disease)      of spine on CT 03/2011----moderate SS at L3-4-5, multilevel foraminal narrowing  . Glaucoma   . Macular degeneration   . Lumbar spinal stenosis   . Lumbosacral spondylosis     Past Surgical History  Procedure Laterality Date  . Hernia repair    . Cholecystectomy    . Cataract extraction Bilateral   . Tonsillectomy      Family History  Problem Relation Age of Onset  . Cancer Father   . Dementia Mother   . Heart attack    . Heart attack Brother     Social history:  reports that he has never smoked. He has never used smokeless tobacco. He reports that he does not drink alcohol. His drug history is not on file.   No Known Allergies  Medications:  Current Outpatient  Prescriptions on File Prior to Visit  Medication Sig Dispense Refill  . aspirin 81 MG tablet Take 81 mg by mouth daily.      . dorzolamide (TRUSOPT) 2 % ophthalmic solution 1 drop 3 (three) times daily.      . Multiple Vitamin (MULTIVITAMIN) capsule Take 1 capsule by mouth daily.      . nitroGLYCERIN (NITROSTAT) 0.4 MG SL tablet Place 0.4 mg under the tongue every 5 (five) minutes as needed for chest pain.      . Omega-3 Fatty Acids (FISH OIL PO) Take 1,400 mg by mouth.      . tamsulosin (FLOMAX) 0.4 MG CAPS capsule        No current facility-administered medications on file prior to visit.    ROS:  Out of a complete 14 system review of symptoms, the patient complains only of the following symptoms, and all other reviewed systems are negative.  Walking difficulty Numbness  Blood pressure 146/66, pulse 48, weight 228 lb (103.42 kg).  Physical Exam  General: The patient is alert and cooperative at the time of the examination.  Skin: 2 to 3+ edema below the knees is noted bilaterally.   Neurologic Exam  Mental status: The patient is oriented x 3.  Cranial nerves: Facial symmetry is present. Speech is normal, no aphasia or  dysarthria is noted. Extraocular movements are full. Visual fields are restricted with a relative left homonymous visual field deficit.  Motor: The patient has good strength in all 4 extremities.  Sensory examination: Soft touch sensation is symmetric on the face, arms, and legs.  Coordination: The patient has good finger-nose-finger and heel-to-shin bilaterally.  Gait and station: The patient has a slightly wide-based gait, the patient uses a cane for ambulation. Tandem gait is unsteady. Romberg is negative. No drift is seen.  Reflexes: Deep tendon reflexes are symmetric, but are depressed.   Assessment/Plan:  1. Peripheral neuropathy  2. Gait disorder  The patient is having occasional nights with significant neuropathy pain. The patient will be  given a prescription for a topical ointment for peripheral neuropathy discomfort. The patient will continue the nortriptyline for now. A prescription will be given for the nortriptyline. The patient will followup in one year.  Jill Alexanders MD 10/09/2013 7:35 PM  [facility] [address] [place], [place] [address]  Phone [phone] Fax [phone]

## 2013-11-05 DIAGNOSIS — N403 Nodular prostate with lower urinary tract symptoms: Secondary | ICD-10-CM | POA: Diagnosis not present

## 2013-11-05 DIAGNOSIS — N3020 Other chronic cystitis without hematuria: Secondary | ICD-10-CM | POA: Diagnosis not present

## 2013-11-05 DIAGNOSIS — N138 Other obstructive and reflux uropathy: Secondary | ICD-10-CM | POA: Diagnosis not present

## 2013-11-05 DIAGNOSIS — R82998 Other abnormal findings in urine: Secondary | ICD-10-CM | POA: Diagnosis not present

## 2013-11-18 DIAGNOSIS — G609 Hereditary and idiopathic neuropathy, unspecified: Secondary | ICD-10-CM | POA: Diagnosis not present

## 2013-11-18 DIAGNOSIS — N400 Enlarged prostate without lower urinary tract symptoms: Secondary | ICD-10-CM | POA: Diagnosis not present

## 2013-11-18 DIAGNOSIS — K047 Periapical abscess without sinus: Secondary | ICD-10-CM | POA: Diagnosis not present

## 2013-11-18 DIAGNOSIS — Z23 Encounter for immunization: Secondary | ICD-10-CM | POA: Diagnosis not present

## 2013-11-18 DIAGNOSIS — E119 Type 2 diabetes mellitus without complications: Secondary | ICD-10-CM | POA: Diagnosis not present

## 2013-11-18 DIAGNOSIS — R269 Unspecified abnormalities of gait and mobility: Secondary | ICD-10-CM | POA: Diagnosis not present

## 2013-11-18 DIAGNOSIS — I10 Essential (primary) hypertension: Secondary | ICD-10-CM | POA: Diagnosis not present

## 2013-11-18 DIAGNOSIS — I6990 Unspecified sequelae of unspecified cerebrovascular disease: Secondary | ICD-10-CM | POA: Diagnosis not present

## 2013-11-21 ENCOUNTER — Emergency Department (HOSPITAL_COMMUNITY)
Admission: EM | Admit: 2013-11-21 | Discharge: 2013-11-22 | Disposition: A | Discharge status: Home / Self Care | Payer: Medicare Other | Attending: Emergency Medicine | Admitting: Emergency Medicine

## 2013-11-21 ENCOUNTER — Encounter (HOSPITAL_COMMUNITY): Payer: Self-pay | Admitting: Emergency Medicine

## 2013-11-21 DIAGNOSIS — E785 Hyperlipidemia, unspecified: Secondary | ICD-10-CM | POA: Insufficient documentation

## 2013-11-21 DIAGNOSIS — E119 Type 2 diabetes mellitus without complications: Secondary | ICD-10-CM | POA: Insufficient documentation

## 2013-11-21 DIAGNOSIS — R5383 Other fatigue: Secondary | ICD-10-CM | POA: Insufficient documentation

## 2013-11-21 DIAGNOSIS — I10 Essential (primary) hypertension: Secondary | ICD-10-CM | POA: Diagnosis not present

## 2013-11-21 DIAGNOSIS — Z8673 Personal history of transient ischemic attack (TIA), and cerebral infarction without residual deficits: Secondary | ICD-10-CM | POA: Insufficient documentation

## 2013-11-21 DIAGNOSIS — Z792 Long term (current) use of antibiotics: Secondary | ICD-10-CM | POA: Diagnosis not present

## 2013-11-21 DIAGNOSIS — R5381 Other malaise: Secondary | ICD-10-CM | POA: Diagnosis not present

## 2013-11-21 DIAGNOSIS — Z8739 Personal history of other diseases of the musculoskeletal system and connective tissue: Secondary | ICD-10-CM | POA: Insufficient documentation

## 2013-11-21 DIAGNOSIS — R112 Nausea with vomiting, unspecified: Secondary | ICD-10-CM

## 2013-11-21 DIAGNOSIS — Z8669 Personal history of other diseases of the nervous system and sense organs: Secondary | ICD-10-CM | POA: Insufficient documentation

## 2013-11-21 DIAGNOSIS — I4949 Other premature depolarization: Secondary | ICD-10-CM | POA: Diagnosis not present

## 2013-11-21 DIAGNOSIS — I493 Ventricular premature depolarization: Secondary | ICD-10-CM

## 2013-11-21 DIAGNOSIS — Z79899 Other long term (current) drug therapy: Secondary | ICD-10-CM | POA: Insufficient documentation

## 2013-11-21 DIAGNOSIS — Z7982 Long term (current) use of aspirin: Secondary | ICD-10-CM | POA: Diagnosis not present

## 2013-11-21 LAB — COMPREHENSIVE METABOLIC PANEL
AST: 32 U/L (ref 0–37)
Albumin: 3.8 g/dL (ref 3.5–5.2)
Chloride: 102 mEq/L (ref 96–112)
Creatinine, Ser: 0.98 mg/dL (ref 0.50–1.35)
Potassium: 5.2 mEq/L (ref 3.7–5.3)
Total Bilirubin: 1 mg/dL (ref 0.3–1.2)
Total Protein: 8.3 g/dL (ref 6.0–8.3)

## 2013-11-21 LAB — COMPREHENSIVE METABOLIC PANEL WITH GFR
ALT: 27 U/L (ref 0–53)
Alkaline Phosphatase: 118 U/L — ABNORMAL HIGH (ref 39–117)
BUN: 21 mg/dL (ref 6–23)
CO2: 25 meq/L (ref 19–32)
Calcium: 9.4 mg/dL (ref 8.4–10.5)
GFR calc Af Amer: 83 mL/min — ABNORMAL LOW (ref >90)
GFR calc non Af Amer: 72 mL/min — ABNORMAL LOW (ref >90)
Glucose, Bld: 144 mg/dL — ABNORMAL HIGH (ref 70–99)
Sodium: 141 meq/L (ref 137–147)

## 2013-11-21 LAB — I-STAT TROPONIN, ED: Troponin i, poc: 0 ng/mL (ref 0.00–0.08)

## 2013-11-21 LAB — URINALYSIS, ROUTINE W REFLEX MICROSCOPIC
Bilirubin Urine: NEGATIVE
Glucose, UA: NEGATIVE mg/dL
Hgb urine dipstick: NEGATIVE
Ketones, ur: 15 mg/dL — AB
Nitrite: NEGATIVE
Protein, ur: 100 mg/dL — AB
Specific Gravity, Urine: 1.025 (ref 1.005–1.030)
Urobilinogen, UA: 0.2 mg/dL (ref 0.0–1.0)
pH: 7 (ref 5.0–8.0)

## 2013-11-21 LAB — URINE MICROSCOPIC-ADD ON

## 2013-11-21 LAB — CBC WITH DIFFERENTIAL/PLATELET
Basophils Absolute: 0 10*3/uL (ref 0.0–0.1)
Basophils Relative: 0 % (ref 0–1)
Eosinophils Absolute: 0.2 10*3/uL (ref 0.0–0.7)
Eosinophils Relative: 2 % (ref 0–5)
HCT: 42.5 % (ref 39.0–52.0)
Hemoglobin: 15.2 g/dL (ref 13.0–17.0)
Lymphocytes Relative: 17 % (ref 12–46)
Lymphs Abs: 1.4 K/uL (ref 0.7–4.0)
MCH: 33.3 pg (ref 26.0–34.0)
MCHC: 35.8 g/dL (ref 30.0–36.0)
MCV: 93 fL (ref 78.0–100.0)
Monocytes Absolute: 0.8 K/uL (ref 0.1–1.0)
Monocytes Relative: 10 % (ref 3–12)
Neutro Abs: 6 10*3/uL (ref 1.7–7.7)
Neutrophils Relative %: 71 % (ref 43–77)
Platelets: 222 K/uL (ref 150–400)
RBC: 4.57 MIL/uL (ref 4.22–5.81)
RDW: 13.5 % (ref 11.5–15.5)
WBC: 8.4 K/uL (ref 4.0–10.5)

## 2013-11-21 MED ORDER — SODIUM CHLORIDE 0.9 % IV BOLUS (SEPSIS)
500.0000 mL | Freq: Once | INTRAVENOUS | Status: AC
  Administered 2013-11-21: 500 mL via INTRAVENOUS

## 2013-11-21 MED ORDER — METOPROLOL SUCCINATE ER 50 MG PO TB24
50.0000 mg | ORAL_TABLET | Freq: Every day | ORAL | Status: DC
  Administered 2013-11-21: 50 mg via ORAL
  Filled 2013-11-21: qty 1

## 2013-11-21 MED ORDER — ONDANSETRON HCL 4 MG/2ML IJ SOLN
4.0000 mg | Freq: Once | INTRAMUSCULAR | Status: AC
  Administered 2013-11-21: 4 mg via INTRAVENOUS
  Filled 2013-11-21: qty 2

## 2013-11-21 MED ORDER — ENALAPRIL MALEATE 10 MG PO TABS
10.0000 mg | ORAL_TABLET | Freq: Every day | ORAL | Status: DC
  Administered 2013-11-21: 10 mg via ORAL
  Filled 2013-11-21: qty 1

## 2013-11-21 NOTE — ED Notes (Signed)
Dr Zavitz at bedside  

## 2013-11-21 NOTE — ED Provider Notes (Signed)
CSN: [id number]     Arrival date & time 11/21/13  1645 History   First MD Initiated Contact with Patient 11/21/13 1852     Chief Complaint  Patient presents with  . Vomiting  . Hypertension     (Consider location/radiation/quality/duration/timing/severity/associated sxs/prior Treatment) HPI Comments: Pt had dental work on Wednesday, tooth broke, saw an oral surgeon who had to remove the root and then placed on keflex and norco.  He had doses on Wed and Thursday AM and no further doses of either since then.  Also didn't take any of his usual BP meds due to not feeling well.  No syncope, felt weak generalized, no CP, HA, SOB, back pain, abd pain.  No other new medications, no fevers, chills.  Spoke to PCP office and directed to come to the ED.    Patient is a 80 y.o. male presenting with vomiting. The history is provided by the patient, a relative and the spouse.  Emesis Severity:  Severe Duration:  2 days Timing:  Constant Quality:  Stomach contents Able to tolerate:  Liquids Progression:  Partially resolved Chronicity:  New Context: not post-tussive and not self-induced   Relieved by:  Nothing Worsened by:  Nothing tried Associated symptoms: no abdominal pain, no chills, no diarrhea and no fever   Risk factors: diabetes   Risk factors: no alcohol use, no sick contacts and no suspect food intake   Risk factors comment:  Prior hernia surgery at age 54   Past Medical History  Diagnosis Date  . Diabetes   . 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 Marlou Porch  . Hyperlipidemia   . HTN (hypertension)   . Edema   . Neuropathy      feet, hands since 1992- seen neuro in past- idiopathic per prior work up- now sees Dr. Jannifer Franklin- failed Cymbalta  . Gait instability   . Peripheral edema   . DDD (degenerative disc disease)      of spine on CT 03/2011----moderate SS at L3-4-5, multilevel foraminal narrowing  . Glaucoma   . Macular degeneration   . Lumbar spinal  stenosis   . Lumbosacral spondylosis    Past Surgical History  Procedure Laterality Date  . Hernia repair    . Cholecystectomy    . Cataract extraction Bilateral   . Tonsillectomy     Family History  Problem Relation Age of Onset  . Cancer Father   . Dementia Mother   . Heart attack    . Heart attack Brother    History  Substance Use Topics  . Smoking status: Never Smoker   . Smokeless tobacco: Never Used  . Alcohol Use: No    Review of Systems  Constitutional: Positive for fatigue. Negative for chills.  Respiratory: Negative for cough and shortness of breath.   Cardiovascular: Negative for chest pain and leg swelling.  Gastrointestinal: Positive for nausea and vomiting. Negative for abdominal pain and diarrhea.  Musculoskeletal: Negative for back pain.  Neurological: Positive for weakness. Negative for dizziness.  All other systems reviewed and are negative.      Allergies  Review of patient's allergies indicates no known allergies.  Home Medications   Current Outpatient Rx  Name  Route  Sig  Dispense  Refill  . aspirin 81 MG tablet   Oral   Take 81 mg by mouth daily.         Marland Kitchen atorvastatin (LIPITOR) 10 MG tablet   Oral   Take  10 mg by mouth at bedtime.         . cephALEXin (KEFLEX) 500 MG capsule   Oral   Take 500 mg by mouth 4 (four) times daily.         . dorzolamide (TRUSOPT) 2 % ophthalmic solution   Both Eyes   Place 1 drop into both eyes 3 (three) times daily.          . enalapril (VASOTEC) 10 MG tablet   Oral   Take 10 mg by mouth daily.         . finasteride (PROSCAR) 5 MG tablet   Oral   Take 5 mg by mouth daily.         . furosemide (LASIX) 20 MG tablet   Oral   Take 20 mg by mouth daily.         Marland Kitchen gabapentin (NEURONTIN) 800 MG tablet   Oral   Take 800 mg by mouth 3 (three) times daily.         Marland Kitchen HYDROcodone-acetaminophen (NORCO/VICODIN) 5-325 MG per tablet   Oral   Take 1 tablet by mouth every 6 (six) hours as  needed for moderate pain.         Marland Kitchen latanoprost (XALATAN) 0.005 % ophthalmic solution   Both Eyes   Place 0.005 drops into both eyes daily.         . metoprolol succinate (TOPROL-XL) 50 MG 24 hr tablet   Oral   Take 50 mg by mouth daily.         . Multiple Vitamin (MULTIVITAMIN) capsule   Oral   Take 1 capsule by mouth daily.         Marland Kitchen NITRO-BID 2 % ointment   Topical   Apply 2 g topically daily.         . nitroGLYCERIN (NITROSTAT) 0.4 MG SL tablet   Sublingual   Place 0.4 mg under the tongue every 5 (five) minutes as needed for chest pain.         . nortriptyline (PAMELOR) 25 MG capsule   Oral   Take 3 capsules (75 mg total) by mouth at bedtime.   90 capsule   11   . Omega-3 Fatty Acids (FISH OIL PO)   Oral   Take 1,400 mg by mouth.         . tamsulosin (FLOMAX) 0.4 MG CAPS capsule   Oral   Take 0.4 mg by mouth daily.          . tamsulosin (FLOMAX) 0.4 MG CAPS capsule   Oral   Take 0.4 mg by mouth daily.         . Vit C-Cholecalciferol-Rose Hip (VITAMIN C & D3/ROSE HIPS) [phone] MG-UNIT-MG CAPS   Oral   Take 1 capsule by mouth daily.          BP 193/78  Pulse 113  Temp(Src) 99.4 F (37.4 C) (Rectal)  Resp 21  Ht 5\' 8"  (1.727 m)  Wt 228 lb (103.42 kg)  BMI 34.68 kg/m2  SpO2 96% Physical Exam  Nursing note and vitals reviewed. Constitutional: He is oriented to person, place, and time. He appears well-developed and well-nourished. No distress.  HENT:  Head: Normocephalic and atraumatic.  Mouth/Throat:    Eyes: EOM are normal.  Neck: Neck supple.  Cardiovascular: Normal rate and intact distal pulses.  An irregular rhythm present. Frequent extrasystoles are present.  Pulmonary/Chest: Effort normal. No respiratory distress. He has no wheezes. He has no rales.  Abdominal: Soft. He exhibits no distension. There is no tenderness. There is no rebound and no guarding.  Musculoskeletal: He exhibits no tenderness.  Neurological: He is  alert and oriented to person, place, and time.  Skin: Skin is warm. He is not diaphoretic.  Psychiatric: He has a normal mood and affect.    ED Course  Procedures (including critical care time) Labs Review Labs Reviewed  COMPREHENSIVE METABOLIC PANEL - Abnormal; Notable for the following:    Glucose, Bld 144 (*)    Alkaline Phosphatase 118 (*)    GFR calc non Af Amer 72 (*)    GFR calc Af Amer 83 (*)    All other components within normal limits  URINALYSIS, ROUTINE W REFLEX MICROSCOPIC - Abnormal; Notable for the following:    APPearance TURBID (*)    Ketones, ur 15 (*)    Protein, ur 100 (*)    Leukocytes, UA TRACE (*)    All other components within normal limits  CBC WITH DIFFERENTIAL  URINE MICROSCOPIC-ADD ON  TROPONIN I  I-STAT TROPOININ, ED   Imaging Review No results found.   EKG Interpretation   Date/Time:  Friday November 21 2013 19:52:10 EDT Ventricular Rate:  102 PR Interval:  146 QRS Duration: 146 QT Interval:  375 QTC Calculation: 488 R Axis:   82 Text Interpretation:  Sinus tachycardia Ventricular bigeminy Right bundle  branch block Abnormal ekg No previous tracing Confirmed by [facility]  MD,  MICHEAL ([id number]) on 11/21/2013 8:23:00 PM     RA sat is 100% and I interpret to be normal  11:14 PM Pt reports feeling improved, but has had 3 episodes of urination.  UA does not suggest UTI however.  Pt's HR remains fast at 110 with freq PVC's.  Reviewing cardiology note from 10/14, pt's HR was in 70's, no listed h/o PVC's, but there is mild valvular disease from prior ECHO, pt did have a stress nuclear test that was negative.  Electrolytes here are unremarkable.  Will give additional IVF's and continue to monitor cardiac.  Check temp;  12:07 AM After additional IVF, HR is down.  Pt verbalizes that his physicians have mentioned PVC's to him in the past.  Despite continued elevated BP, he has no SOB, CP, will trial ambulation in the hallway.  Troponin is negative thus I  doubt ACS.  If pt can tolerate ambulation, there appears to be no evidence of end organ failure, pt can continue taking his oral antihtn's and follow up with PCP next week.  Will give Rx for nausea for home.     12:14 AM Pt ambulated with cane and minimal assistance mainly due to his neuropathy.  He reports no SOB, dizziness, CP, abd pain.  Will d/c home.  Pt desire to go home, I recommend close outpt follow up.     MDM   Final diagnoses:  Nausea and vomiting  Frequent PVCs  Hypertension    Pt with non tender abd, no CP or back pain, no HA.  No focal neuro deficits.  Lots of ectopy on ECG, will check electrolytes and supplement as needed.  Will hydrate, treat with zofran and continue to monitor for improvement.  BP likely up due to not taking any today.  Will give oral antiHTN's and monitor.      Saddie Benders. Dorna Mai, MD 11/22/13 [phone]

## 2013-11-21 NOTE — ED Notes (Signed)
Pt c/o nausea and vomiting after having teeth pulled on Wednesday. Pt began having sx Thursday morning. Pt has been taking cephalexin and pain medication. Pt states he has vomited 3xs in the last 24hrs. Has not been able to keep anything down, and has not been able to take his daily medicines. Pt denies any pain at this time.

## 2013-11-21 NOTE — ED Notes (Signed)
Pt reports that he had a tooth removed on Tuesday and has not been feeling well since then. States he was placed on antibiotics and pain medication. Reports he has had some vomiting an HTN. Denies any abd pain.

## 2013-11-22 LAB — TROPONIN I: Troponin I: <0.3 ng/mL (ref <0.30)

## 2013-11-22 MED ORDER — PROMETHAZINE HCL 25 MG PO TABS: 25.0000 mg | ORAL_TABLET | Freq: Four times a day (QID) | ORAL | Status: DC | PRN

## 2013-11-22 NOTE — ED Notes (Signed)
The patient was ambulated approximately 10 feet thru the hallway. The tech has reported to the RN in charge.

## 2013-11-22 NOTE — ED Notes (Signed)
Discharge instructions reviewed with pt. Pt verbalized understanding.   

## 2013-11-22 NOTE — Discharge Instructions (Signed)
Nausea and Vomiting Nausea is a sick feeling that often comes before throwing up (vomiting). Vomiting is a reflex where stomach contents come out of your mouth. Vomiting can cause severe loss of body fluids (dehydration). Children and elderly adults can become dehydrated quickly, especially if they also have diarrhea. Nausea and vomiting are symptoms of a condition or disease. It is important to find the cause of your symptoms. CAUSES   Direct irritation of the stomach lining. This irritation can result from increased acid production (gastroesophageal reflux disease), infection, food poisoning, taking certain medicines (such as nonsteroidal anti-inflammatory drugs), alcohol use, or tobacco use.  Signals from the brain.These signals could be caused by a headache, heat exposure, an inner ear disturbance, increased pressure in the brain from injury, infection, a tumor, or a concussion, pain, emotional stimulus, or metabolic problems.  An obstruction in the gastrointestinal tract (bowel obstruction).  Illnesses such as diabetes, hepatitis, gallbladder problems, appendicitis, kidney problems, cancer, sepsis, atypical symptoms of a heart attack, or eating disorders.  Medical treatments such as chemotherapy and radiation.  Receiving medicine that makes you sleep (general anesthetic) during surgery. DIAGNOSIS Your caregiver may ask for tests to be done if the problems do not improve after a few days. Tests may also be done if symptoms are severe or if the reason for the nausea and vomiting is not clear. Tests may include:  Urine tests.  Blood tests.  Stool tests.  Cultures (to look for evidence of infection).  X-rays or other imaging studies. Test results can help your caregiver make decisions about treatment or the need for additional tests. TREATMENT You need to stay well hydrated. Drink frequently but in small amounts.You may wish to drink water, sports drinks, clear broth, or eat frozen  ice pops or gelatin dessert to help stay hydrated.When you eat, eating slowly may help prevent nausea.There are also some antinausea medicines that may help prevent nausea. HOME CARE INSTRUCTIONS   Take all medicine as directed by your caregiver.  If you do not have an appetite, do not force yourself to eat. However, you must continue to drink fluids.  If you have an appetite, eat a normal diet unless your caregiver tells you differently.  Eat a variety of complex carbohydrates (rice, wheat, potatoes, bread), lean meats, yogurt, fruits, and vegetables.  Avoid high-fat foods because they are more difficult to digest.  Drink enough water and fluids to keep your urine clear or pale yellow.  If you are dehydrated, ask your caregiver for specific rehydration instructions. Signs of dehydration may include:  Severe thirst.  Dry lips and mouth.  Dizziness.  Dark urine.  Decreasing urine frequency and amount.  Confusion.  Rapid breathing or pulse. SEEK IMMEDIATE MEDICAL CARE IF:   You have blood or brown flecks (like coffee grounds) in your vomit.  You have black or bloody stools.  You have a severe headache or stiff neck.  You are confused.  You have severe abdominal pain.  You have chest pain or trouble breathing.  You do not urinate at least once every 8 hours.  You develop cold or clammy skin.  You continue to vomit for longer than 24 to 48 hours.  You have a fever. MAKE SURE YOU:   Understand these instructions.  Will watch your condition.  Will get help right away if you are not doing well or get worse. Document Released: 08/14/2005 Document Revised: 11/06/2011 Document Reviewed: 01/11/2011 [facility] Patient Information 2014 [place], [place].     Premature  Ventricular Contraction Premature ventricular contraction (PVC) is an irregularity of the heart rhythm involving extra or skipped heartbeats. In some cases, they may occur without obvious cause or heart  disease. Other times, they can be caused by an electrolyte change in the blood. These need to be corrected. They can also be seen when there is not enough oxygen going to the heart. A common cause of this is plaque or cholesterol buildup. This buildup decreases the blood supply to the heart. In addition, extra beats may be caused or aggravated by:  Excessive smoking.  Alcohol consumption.  Caffeine.  Certain medications  Some street drugs. SYMPTOMS   The sensation of feeling your heart skipping a beat (palpitations).  In many cases, the person may have no symptoms. SIGNS AND TESTS   A physical examination may show an occasional irregularity, but if the PVC beats do not happen often, they may not be found on physical exam.  Blood pressure is usually normal.  Other tests that may find extra beats of the heart are:  An EKG (electrocardiogram)  A Holter monitor which can monitor your heart over longer periods of time  An Angiogram (study of the heart arteries). TREATMENT  Usually extra heartbeats do not need treatment. The condition is treated only if symptoms are severe or if extra beats are very frequent or are causing problems. An underlying cause, if discovered, may also require treatment.  Treatment may also be needed if there may be a risk for other more serious cardiac arrhythmias.  PREVENTION   Moderation in caffeine, alcohol, and tobacco use may reduce the risk of ectopic heartbeats in some people.  Exercise often helps people who lead a sedentary (inactive) lifestyle. PROGNOSIS  PVC heartbeats are generally harmless and do not need treatment.  RISKS AND COMPLICATIONS   Ventricular tachycardia (occasionally).  There usually are no complications.  Other arrhythmias (occasionally). SEEK IMMEDIATE MEDICAL CARE IF:   You feel palpitations that are frequent or continual.  You develop chest pain or other problems such as shortness of breath, sweating, or nausea and  vomiting.  You become light-headed or faint (pass out).  You get worse or do not improve with treatment. Document Released: 03/31/2004 Document Revised: 11/06/2011 Document Reviewed: 10/11/2007 [facility] Patient Information 2014 [place].

## 2013-11-24 ENCOUNTER — Telehealth: Payer: Self-pay | Admitting: Cardiology

## 2013-11-24 NOTE — Telephone Encounter (Signed)
Pt calling for follow-up appointment for ekg changes seen on 11/21/13 ED visit. Pt states PVC's are not new for him.  He states he was told to see Dr. Marlou Porch within the next 5 days. I have offered him an appt with Ermalinda Barrios PA Appt made for 11/26/13 with Ermalinda Barrios PA  Pt states he is feeling very well today Horton Chin RN

## 2013-11-24 NOTE — Telephone Encounter (Signed)
New problem   Pt was seen in ED and was told to sched appt in 5 day w/Dr Marlou Porch. Please call pt concerning this matter.

## 2013-11-25 DIAGNOSIS — I10 Essential (primary) hypertension: Secondary | ICD-10-CM | POA: Diagnosis not present

## 2013-11-26 ENCOUNTER — Ambulatory Visit: Payer: Medicare Other | Admitting: Physician Assistant

## 2013-11-27 ENCOUNTER — Ambulatory Visit (INDEPENDENT_AMBULATORY_CARE_PROVIDER_SITE_OTHER): Payer: Medicare Other | Admitting: Cardiology

## 2013-11-27 ENCOUNTER — Encounter: Payer: Self-pay | Admitting: Cardiology

## 2013-11-27 VITALS — BP 142/74 | HR 56 | Ht 71.0 in | Wt 222.0 lb

## 2013-11-27 DIAGNOSIS — Z8673 Personal history of transient ischemic attack (TIA), and cerebral infarction without residual deficits: Secondary | ICD-10-CM

## 2013-11-27 DIAGNOSIS — R269 Unspecified abnormalities of gait and mobility: Secondary | ICD-10-CM

## 2013-11-27 DIAGNOSIS — I4949 Other premature depolarization: Secondary | ICD-10-CM

## 2013-11-27 DIAGNOSIS — E7800 Pure hypercholesterolemia, unspecified: Secondary | ICD-10-CM | POA: Insufficient documentation

## 2013-11-27 DIAGNOSIS — I10 Essential (primary) hypertension: Secondary | ICD-10-CM | POA: Diagnosis not present

## 2013-11-27 DIAGNOSIS — R2681 Unsteadiness on feet: Secondary | ICD-10-CM

## 2013-11-27 DIAGNOSIS — I493 Ventricular premature depolarization: Secondary | ICD-10-CM | POA: Insufficient documentation

## 2013-11-27 NOTE — Patient Instructions (Addendum)
Your physician recommends that you continue on your current medications as directed. Please refer to the Current Medication list given to you today.  Your physician wants you to follow-up in: 4 months with Dr. Dawna Part will receive a reminder letter in the mail two months in advance. If you don't receive a letter, please call our office to schedule the follow-up appointment.  Your physician recommends that you return for lab work on 12/11/2013.

## 2013-11-27 NOTE — Progress Notes (Signed)
[place] [facility]. [address] [place], [place]  [address] Phone: [phone] Fax:  [phone]  Date:  11/27/2013   ID:  Cyndia Skeeters, DOB February 17, 1924, MRN [id number]  PCP:  Wynelle Fanny   History of Present Illness: Timothy Cooper is a 80 y.o. male with history of stroke, hypertension, hyperlipidemia here for followup.  Echocardiogram 12/11 with normal ejection fraction, mild MAC, mild mitral regurgitation. Nuclear stress test no evidence of ischemia, normal EF Holter monitor-10 beat irregular wide-complex tachycardia per report. No syncope. On metoprolol.   Diabetes, LDL 47, creatinine 1.2.   10/14 - moved to [place]. Almyra Free has triplets. [place]. Main complaints is neuropathy. Trying NTG left foot.   11/27/13-recent emergency department visit on 11/21/13 4 vomiting hypertension. No syncope, but felt generalized weakness, no chest pain. This occurred after dental work. Oral Psychologist, sport and exercise. In the emergency department, he was found to have PVCs. I reviewed EKG which shows ventricular quadrigeminy. Benign. Asymptomatic.   Wt Readings from Last 3 Encounters:  11/27/13 222 lb (100.699 kg)  11/21/13 228 lb (103.42 kg)  10/09/13 228 lb (103.42 kg)     Past Medical History  Diagnosis Date  . Diabetes   . 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 Marlou Porch  . Hyperlipidemia   . HTN (hypertension)   . Edema   . Neuropathy      feet, hands since 1992- seen neuro in past- idiopathic per prior work up- now sees Dr. Jannifer Franklin- failed Cymbalta  . Gait instability   . Peripheral edema   . DDD (degenerative disc disease)      of spine on CT 03/2011----moderate SS at L3-4-5, multilevel foraminal narrowing  . Glaucoma   . Macular degeneration   . Lumbar spinal stenosis   . Lumbosacral spondylosis     Past Surgical History  Procedure Laterality Date  . Hernia repair    . Cholecystectomy    . Cataract extraction Bilateral   . Tonsillectomy       Current Outpatient Prescriptions  Medication Sig Dispense Refill  . aspirin 81 MG tablet Take 81 mg by mouth daily.      Marland Kitchen atorvastatin (LIPITOR) 10 MG tablet Take 10 mg by mouth at bedtime.      . dorzolamide (TRUSOPT) 2 % ophthalmic solution Place 1 drop into both eyes 3 (three) times daily.       . enalapril (VASOTEC) 10 MG tablet Take 10 mg by mouth daily.      . finasteride (PROSCAR) 5 MG tablet Take 5 mg by mouth daily.      . furosemide (LASIX) 20 MG tablet Take 20 mg by mouth daily.      Marland Kitchen gabapentin (NEURONTIN) 800 MG tablet Take 800 mg by mouth 3 (three) times daily.      Marland Kitchen HYDROcodone-acetaminophen (NORCO/VICODIN) 5-325 MG per tablet Take 1 tablet by mouth every 6 (six) hours as needed for moderate pain.      Marland Kitchen latanoprost (XALATAN) 0.005 % ophthalmic solution Place 0.005 drops into both eyes daily.      . metoprolol succinate (TOPROL-XL) 50 MG 24 hr tablet Take 50 mg by mouth daily.      . Multiple Vitamin (MULTIVITAMIN) capsule Take 1 capsule by mouth daily.      Marland Kitchen NITRO-BID 2 % ointment Apply 2 g topically daily.      . nortriptyline (PAMELOR) 25 MG capsule Take 3 capsules (75 mg total) by mouth at  bedtime.  90 capsule  11  . Omega-3 Fatty Acids (FISH OIL PO) Take 1,400 mg by mouth.      . promethazine (PHENERGAN) 25 MG tablet Take 1 tablet (25 mg total) by mouth every 6 (six) hours as needed for nausea or vomiting.  20 tablet  0  . tamsulosin (FLOMAX) 0.4 MG CAPS capsule Take 0.4 mg by mouth daily.       . tamsulosin (FLOMAX) 0.4 MG CAPS capsule Take 0.4 mg by mouth daily.      . Vit C-Cholecalciferol-Rose Hip (VITAMIN C & D3/ROSE HIPS) [phone] MG-UNIT-MG CAPS Take 1 capsule by mouth daily.       No current facility-administered medications for this visit.    Allergies:   No Known Allergies  Social History:  The patient  reports that he has never smoked. He has never used smokeless tobacco. He reports that he does not drink alcohol.   ROS:  Please see the history  of present illness.   No syncope, no bleeding, no new strokelike symptoms, no chest pain    PHYSICAL EXAM: VS:  BP 142/74  Pulse 56  Ht 5\' 11"  (1.803 m)  Wt 222 lb (100.699 kg)  BMI 30.98 kg/m2 Well nourished, well developed, in no acute distress HEENT: normal Neck: no JVD Cardiac:  normal S1, S2; RRR; no murmurFrequent ectopy noted Lungs:  clear to auscultation bilaterally, no wheezing, rhonchi or rales Abd: soft, nontender, no hepatomegaly Ext: no edema Skin: warm and dry Neuro: no focal abnormalities noted  EKG:  Sinus rhythm, 88 with no other changes     ECHO 2011: 1. Normal LV size and function.  2. There were no regional wall motion abnormalities.  3. Left ventricular ejection fraction estimated by 2D at 60-65 percent.  4. Mild left atrial enlargement.  5. Mild mitral annular calcification.  6. Mild mitral valve regurgitation.  7. Trace aortic valve regurgitation.  8. Analysis of mitral valve inflow, pulmonary vein Doppler and tissue Doppler suggests grade I diastolic dysfunction without elevated left atrial pressure.  ASSESSMENT AND PLAN:  1. Hypertension -medications reviewed. Doing very well. No changes made today. Continue with low-sodium diet. 2. Prior stroke 3. Gait instability-cane 4. ---Multiple seen on EKG. On exam today I do hear an occasional PVC. Continue with metoprolol. No changes made. 5. Hyperlipidemia-secondary prevention.   Signed, Candee Furbish, MD [facility]  11/27/2013 1:44 PM

## 2013-12-11 ENCOUNTER — Other Ambulatory Visit: Payer: Medicare Other

## 2013-12-11 DIAGNOSIS — J189 Pneumonia, unspecified organism: Secondary | ICD-10-CM | POA: Diagnosis not present

## 2013-12-26 ENCOUNTER — Encounter: Payer: Self-pay | Admitting: Cardiology

## 2014-01-01 ENCOUNTER — Other Ambulatory Visit: Payer: Self-pay | Admitting: Cardiology

## 2014-01-03 ENCOUNTER — Encounter (HOSPITAL_COMMUNITY): Payer: Self-pay | Admitting: Emergency Medicine

## 2014-01-03 ENCOUNTER — Emergency Department (HOSPITAL_COMMUNITY): Payer: Medicare Other

## 2014-01-03 ENCOUNTER — Emergency Department (HOSPITAL_COMMUNITY)
Admission: EM | Admit: 2014-01-03 | Discharge: 2014-01-03 | Disposition: A | Discharge status: Home / Self Care | Payer: Medicare Other | Attending: Emergency Medicine | Admitting: Emergency Medicine

## 2014-01-03 DIAGNOSIS — R1110 Vomiting, unspecified: Secondary | ICD-10-CM | POA: Diagnosis not present

## 2014-01-03 DIAGNOSIS — R5381 Other malaise: Secondary | ICD-10-CM | POA: Insufficient documentation

## 2014-01-03 DIAGNOSIS — E785 Hyperlipidemia, unspecified: Secondary | ICD-10-CM | POA: Diagnosis not present

## 2014-01-03 DIAGNOSIS — G589 Mononeuropathy, unspecified: Secondary | ICD-10-CM | POA: Diagnosis not present

## 2014-01-03 DIAGNOSIS — R42 Dizziness and giddiness: Secondary | ICD-10-CM | POA: Diagnosis not present

## 2014-01-03 DIAGNOSIS — Z7982 Long term (current) use of aspirin: Secondary | ICD-10-CM | POA: Diagnosis not present

## 2014-01-03 DIAGNOSIS — Z8739 Personal history of other diseases of the musculoskeletal system and connective tissue: Secondary | ICD-10-CM | POA: Insufficient documentation

## 2014-01-03 DIAGNOSIS — H409 Unspecified glaucoma: Secondary | ICD-10-CM | POA: Insufficient documentation

## 2014-01-03 DIAGNOSIS — R5383 Other fatigue: Secondary | ICD-10-CM | POA: Diagnosis not present

## 2014-01-03 DIAGNOSIS — I10 Essential (primary) hypertension: Secondary | ICD-10-CM | POA: Insufficient documentation

## 2014-01-03 DIAGNOSIS — Z8673 Personal history of transient ischemic attack (TIA), and cerebral infarction without residual deficits: Secondary | ICD-10-CM | POA: Diagnosis not present

## 2014-01-03 DIAGNOSIS — R404 Transient alteration of awareness: Secondary | ICD-10-CM | POA: Diagnosis not present

## 2014-01-03 DIAGNOSIS — J90 Pleural effusion, not elsewhere classified: Secondary | ICD-10-CM | POA: Diagnosis not present

## 2014-01-03 DIAGNOSIS — Z79899 Other long term (current) drug therapy: Secondary | ICD-10-CM | POA: Insufficient documentation

## 2014-01-03 DIAGNOSIS — E119 Type 2 diabetes mellitus without complications: Secondary | ICD-10-CM | POA: Insufficient documentation

## 2014-01-03 LAB — COMPREHENSIVE METABOLIC PANEL
ALK PHOS: 114 U/L (ref 39–117)
ALT: 24 U/L (ref 0–53)
AST: 22 U/L (ref 0–37)
Albumin: 3.2 g/dL — ABNORMAL LOW (ref 3.5–5.2)
BUN: 12 mg/dL (ref 6–23)
CALCIUM: 8.6 mg/dL (ref 8.4–10.5)
CO2: 22 meq/L (ref 19–32)
Chloride: 105 mEq/L (ref 96–112)
Creatinine, Ser: 0.95 mg/dL (ref 0.50–1.35)
GFR calc Af Amer: 84 mL/min — ABNORMAL LOW (ref >90)
GFR, EST NON AFRICAN AMERICAN: 73 mL/min — AB (ref >90)
GLUCOSE: 158 mg/dL — AB (ref 70–99)
POTASSIUM: 4.3 meq/L (ref 3.7–5.3)
SODIUM: 140 meq/L (ref 137–147)
TOTAL PROTEIN: 7.2 g/dL (ref 6.0–8.3)
Total Bilirubin: 0.5 mg/dL (ref 0.3–1.2)

## 2014-01-03 LAB — URINALYSIS, ROUTINE W REFLEX MICROSCOPIC
BILIRUBIN URINE: NEGATIVE
Glucose, UA: NEGATIVE mg/dL
Hgb urine dipstick: NEGATIVE
KETONES UR: NEGATIVE mg/dL
LEUKOCYTES UA: NEGATIVE
Nitrite: NEGATIVE
Protein, ur: NEGATIVE mg/dL
Specific Gravity, Urine: 1.022 (ref 1.005–1.030)
UROBILINOGEN UA: 0.2 mg/dL (ref 0.0–1.0)
pH: 7 (ref 5.0–8.0)

## 2014-01-03 LAB — CBC WITH DIFFERENTIAL/PLATELET
Basophils Absolute: 0 10*3/uL (ref 0.0–0.1)
Basophils Relative: 0 % (ref 0–1)
EOS ABS: 0.1 10*3/uL (ref 0.0–0.7)
EOS PCT: 2 % (ref 0–5)
HCT: 39.8 % (ref 39.0–52.0)
HEMOGLOBIN: 13.4 g/dL (ref 13.0–17.0)
Lymphocytes Relative: 12 % (ref 12–46)
Lymphs Abs: 0.9 10*3/uL (ref 0.7–4.0)
MCH: 32 pg (ref 26.0–34.0)
MCHC: 33.7 g/dL (ref 30.0–36.0)
MCV: 95 fL (ref 78.0–100.0)
MONOS PCT: 5 % (ref 3–12)
Monocytes Absolute: 0.4 10*3/uL (ref 0.1–1.0)
Neutro Abs: 6.3 10*3/uL (ref 1.7–7.7)
Neutrophils Relative %: 81 % — ABNORMAL HIGH (ref 43–77)
PLATELETS: 183 10*3/uL (ref 150–400)
RBC: 4.19 MIL/uL — AB (ref 4.22–5.81)
RDW: 13.8 % (ref 11.5–15.5)
WBC: 7.7 10*3/uL (ref 4.0–10.5)

## 2014-01-03 LAB — TROPONIN I: Troponin I: <0.3 ng/mL (ref <0.30)

## 2014-01-03 MED ORDER — SODIUM CHLORIDE 0.9 % IV SOLN: INTRAVENOUS | Status: DC

## 2014-01-03 MED ORDER — ONDANSETRON HCL 4 MG/2ML IJ SOLN
4.0000 mg | Freq: Once | INTRAMUSCULAR | Status: AC
  Administered 2014-01-03: 4 mg via INTRAVENOUS
  Filled 2014-01-03: qty 2

## 2014-01-03 MED ORDER — SODIUM CHLORIDE 0.9 % IV BOLUS (SEPSIS)
500.0000 mL | Freq: Once | INTRAVENOUS | Status: AC
  Administered 2014-01-03: 500 mL via INTRAVENOUS

## 2014-01-03 MED ORDER — LORAZEPAM 2 MG/ML IJ SOLN
0.5000 mg | Freq: Once | INTRAMUSCULAR | Status: AC
  Administered 2014-01-03: 0.5 mg via INTRAVENOUS
  Filled 2014-01-03: qty 1

## 2014-01-03 MED ORDER — DIPHENHYDRAMINE HCL 50 MG/ML IJ SOLN
25.0000 mg | Freq: Once | INTRAMUSCULAR | Status: AC
  Administered 2014-01-03: 25 mg via INTRAVENOUS
  Filled 2014-01-03: qty 1

## 2014-01-03 MED ORDER — MECLIZINE HCL 25 MG PO TABS: 25.0000 mg | ORAL_TABLET | Freq: Four times a day (QID) | ORAL | Status: DC

## 2014-01-03 NOTE — Discharge Instructions (Signed)

## 2014-01-03 NOTE — ED Notes (Signed)
Pt arrived by [facility] from home with wife with c/o weakness, dizziness, n/v since 1000. Denies any pain. Pt stated that he needed help walking from bed to stretcher d/t weakness and dizziness. 12 lead showed R bundle branch block with Afib with hx. Pt stated that his grandchildren yesterday and stated that they had the flu a week ago.

## 2014-01-03 NOTE — ED Provider Notes (Signed)
CSN: [id number]     Arrival date & time 01/03/14  1244 History   First MD Initiated Contact with Patient 01/03/14 1252     Chief Complaint  Patient presents with  . Dizziness  . Weakness  . Emesis     (Consider location/radiation/quality/duration/timing/severity/associated sxs/prior Treatment) Patient is a 80 y.o. male presenting with dizziness, weakness, and vomiting. The history is provided by the patient.  Dizziness Associated symptoms: vomiting   Weakness  Emesis  Patient here with sudden onset of dizziness and weakness. Denies any headache. No chest pain chest pressure. No shortness breath. No abdominal pain. No syncope or near-syncope. His emesis was non-bilious Patient describes the room is spinning and worse with head movement and better with remaining still. Denies any ear pain. No prior history of same. No medications used prior to arrival. EMS transported the patient  Past Medical History  Diagnosis Date  . Diabetes   . 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 Marlou Porch  . Hyperlipidemia   . HTN (hypertension)   . Edema   . Neuropathy      feet, hands since 1992- seen neuro in past- idiopathic per prior work up- now sees Dr. Jannifer Franklin- failed Cymbalta  . Gait instability   . Peripheral edema   . DDD (degenerative disc disease)      of spine on CT 03/2011----moderate SS at L3-4-5, multilevel foraminal narrowing  . Glaucoma   . Macular degeneration   . Lumbar spinal stenosis   . Lumbosacral spondylosis    Past Surgical History  Procedure Laterality Date  . Hernia repair    . Cholecystectomy    . Cataract extraction Bilateral   . Tonsillectomy     Family History  Problem Relation Age of Onset  . Cancer Father   . Dementia Mother   . Heart attack    . Heart attack Brother    History  Substance Use Topics  . Smoking status: Never Smoker   . Smokeless tobacco: Never Used  . Alcohol Use: No    Review of Systems   Gastrointestinal: Positive for vomiting.  Neurological: Positive for dizziness and weakness.  All other systems reviewed and are negative.     Allergies  Review of patient's allergies indicates no known allergies.  Home Medications   Prior to Admission medications   Medication Sig Start Date End Date Taking? Authorizing Provider  aspirin 81 MG tablet Take 81 mg by mouth daily.    Historical Provider, MD  atorvastatin (LIPITOR) 10 MG tablet Take 10 mg by mouth at bedtime. 07/21/13   Historical Provider, MD  dorzolamide (TRUSOPT) 2 % ophthalmic solution Place 1 drop into both eyes 3 (three) times daily.     Historical Provider, MD  enalapril (VASOTEC) 10 MG tablet TAKE 1 TABLET BY MOUTH DAILY 01/01/14   Candee Furbish, MD  finasteride (PROSCAR) 5 MG tablet Take 5 mg by mouth daily. 09/22/13   Historical Provider, MD  furosemide (LASIX) 20 MG tablet Take 20 mg by mouth daily. 07/17/13   Historical Provider, MD  gabapentin (NEURONTIN) 800 MG tablet Take 800 mg by mouth 3 (three) times daily. 07/17/13   Historical Provider, MD  HYDROcodone-acetaminophen (NORCO/VICODIN) 5-325 MG per tablet Take 1 tablet by mouth every 6 (six) hours as needed for moderate pain.    Historical Provider, MD  latanoprost (XALATAN) 0.005 % ophthalmic solution Place 0.005 drops into both eyes daily. 09/12/13   Historical Provider, MD  metoprolol succinate (TOPROL-XL)  50 MG 24 hr tablet Take 50 mg by mouth daily. 07/17/13   Historical Provider, MD  Multiple Vitamin (MULTIVITAMIN) capsule Take 1 capsule by mouth daily.    Historical Provider, MD  NITRO-BID 2 % ointment Apply 2 g topically daily. 09/23/13   Historical Provider, MD  nortriptyline (PAMELOR) 25 MG capsule Take 3 capsules (75 mg total) by mouth at bedtime. 10/09/13   Kathrynn Ducking, MD  Omega-3 Fatty Acids (FISH OIL PO) Take 1,400 mg by mouth.    Historical Provider, MD  promethazine (PHENERGAN) 25 MG tablet Take 1 tablet (25 mg total) by mouth every 6 (six)  hours as needed for nausea or vomiting. 11/22/13   Saddie Benders. Ghim, MD  tamsulosin (FLOMAX) 0.4 MG CAPS capsule Take 0.4 mg by mouth daily.  04/10/13   Historical Provider, MD  tamsulosin (FLOMAX) 0.4 MG CAPS capsule Take 0.4 mg by mouth daily. 07/04/13   Historical Provider, MD  Vit C-Cholecalciferol-Rose Hip (VITAMIN C & D3/ROSE HIPS) [phone] MG-UNIT-MG CAPS Take 1 capsule by mouth daily.    Historical Provider, MD   BP 170/74  Pulse 90  Temp(Src) 97.9 F (36.6 C) (Oral)  Resp 15  Wt 218 lb (98.884 kg)  SpO2 97% Physical Exam  Nursing note and vitals reviewed. Constitutional: He is oriented to person, place, and time. He appears well-developed and well-nourished.  Non-toxic appearance. No distress.  HENT:  Head: Normocephalic and atraumatic.  Eyes: Conjunctivae, EOM and lids are normal. Pupils are equal, round, and reactive to light.  Neck: Normal range of motion. Neck supple. No tracheal deviation present. No mass present.  Cardiovascular: Normal rate, regular rhythm and normal heart sounds.  Exam reveals no gallop.   No murmur heard. Pulmonary/Chest: Effort normal and breath sounds normal. No stridor. No respiratory distress. He has no decreased breath sounds. He has no wheezes. He has no rhonchi. He has no rales.  Abdominal: Soft. Normal appearance and bowel sounds are normal. He exhibits no distension. There is no tenderness. There is no rebound and no CVA tenderness.  Musculoskeletal: Normal range of motion. He exhibits no edema and no tenderness.  Neurological: He is alert and oriented to person, place, and time. He has normal strength. No cranial nerve deficit or sensory deficit. Coordination normal. GCS eye subscore is 4. GCS verbal subscore is 5. GCS motor subscore is 6.  No nystagmus  Skin: Skin is warm and dry. No abrasion and no rash noted.  Psychiatric: He has a normal mood and affect. His speech is normal and behavior is normal.    ED Course  Procedures (including  critical care time) Labs Review Labs Reviewed  URINE CULTURE  CBC WITH DIFFERENTIAL  COMPREHENSIVE METABOLIC PANEL  URINALYSIS, ROUTINE W REFLEX MICROSCOPIC  TROPONIN I    Imaging Review No results found.   EKG Interpretation   Date/Time:  Saturday Jan 03 2014 13:02:47 EDT Ventricular Rate:  114 PR Interval:  174 QRS Duration: 151 QT Interval:  333 QTC Calculation: 459 R Axis:   86 Text Interpretation:  Sinus tachycardia Ventricular bigeminy Right bundle  branch block No significant change since last tracing Confirmed by Stacie Knutzen   MD, Tywon Niday ([id number]) on 01/03/2014 1:06:30 PM      MDM   Final diagnoses:  None    Patient just completed a course of antibiotics for pneumonia. Chest x-ray is consistent with resolving pneumonia. Patient given treatment for peripheral vertigo and repeat exam remains stable. Do not think that this represents central vertigo.  stable for discharge  Leota Jacobsen, MD 01/03/14 [phone]

## 2014-01-05 LAB — URINE CULTURE: Colony Count: 5000

## 2014-01-08 DIAGNOSIS — H8110 Benign paroxysmal vertigo, unspecified ear: Secondary | ICD-10-CM | POA: Diagnosis not present

## 2014-01-08 DIAGNOSIS — I10 Essential (primary) hypertension: Secondary | ICD-10-CM | POA: Diagnosis not present

## 2014-01-08 DIAGNOSIS — R269 Unspecified abnormalities of gait and mobility: Secondary | ICD-10-CM | POA: Diagnosis not present

## 2014-01-08 DIAGNOSIS — W19XXXA Unspecified fall, initial encounter: Secondary | ICD-10-CM | POA: Diagnosis not present

## 2014-01-08 DIAGNOSIS — E1149 Type 2 diabetes mellitus with other diabetic neurological complication: Secondary | ICD-10-CM | POA: Diagnosis not present

## 2014-01-08 DIAGNOSIS — G909 Disorder of the autonomic nervous system, unspecified: Secondary | ICD-10-CM | POA: Diagnosis not present

## 2014-01-08 DIAGNOSIS — H539 Unspecified visual disturbance: Secondary | ICD-10-CM | POA: Diagnosis not present

## 2014-01-08 DIAGNOSIS — H3530 Unspecified macular degeneration: Secondary | ICD-10-CM | POA: Diagnosis not present

## 2014-01-08 DIAGNOSIS — J189 Pneumonia, unspecified organism: Secondary | ICD-10-CM | POA: Diagnosis not present

## 2014-01-08 DIAGNOSIS — IMO0001 Reserved for inherently not codable concepts without codable children: Secondary | ICD-10-CM | POA: Diagnosis not present

## 2014-01-08 DIAGNOSIS — G609 Hereditary and idiopathic neuropathy, unspecified: Secondary | ICD-10-CM | POA: Diagnosis not present

## 2014-01-12 DIAGNOSIS — G909 Disorder of the autonomic nervous system, unspecified: Secondary | ICD-10-CM | POA: Diagnosis not present

## 2014-01-12 DIAGNOSIS — E1149 Type 2 diabetes mellitus with other diabetic neurological complication: Secondary | ICD-10-CM | POA: Diagnosis not present

## 2014-01-12 DIAGNOSIS — IMO0001 Reserved for inherently not codable concepts without codable children: Secondary | ICD-10-CM | POA: Diagnosis not present

## 2014-01-12 DIAGNOSIS — I10 Essential (primary) hypertension: Secondary | ICD-10-CM | POA: Diagnosis not present

## 2014-01-12 DIAGNOSIS — H539 Unspecified visual disturbance: Secondary | ICD-10-CM | POA: Diagnosis not present

## 2014-01-12 DIAGNOSIS — H3530 Unspecified macular degeneration: Secondary | ICD-10-CM | POA: Diagnosis not present

## 2014-01-13 DIAGNOSIS — IMO0001 Reserved for inherently not codable concepts without codable children: Secondary | ICD-10-CM | POA: Diagnosis not present

## 2014-01-13 DIAGNOSIS — E1149 Type 2 diabetes mellitus with other diabetic neurological complication: Secondary | ICD-10-CM | POA: Diagnosis not present

## 2014-01-13 DIAGNOSIS — G909 Disorder of the autonomic nervous system, unspecified: Secondary | ICD-10-CM | POA: Diagnosis not present

## 2014-01-13 DIAGNOSIS — H539 Unspecified visual disturbance: Secondary | ICD-10-CM | POA: Diagnosis not present

## 2014-01-13 DIAGNOSIS — H3530 Unspecified macular degeneration: Secondary | ICD-10-CM | POA: Diagnosis not present

## 2014-01-13 DIAGNOSIS — I10 Essential (primary) hypertension: Secondary | ICD-10-CM | POA: Diagnosis not present

## 2014-01-15 DIAGNOSIS — H3530 Unspecified macular degeneration: Secondary | ICD-10-CM | POA: Diagnosis not present

## 2014-01-15 DIAGNOSIS — I10 Essential (primary) hypertension: Secondary | ICD-10-CM | POA: Diagnosis not present

## 2014-01-15 DIAGNOSIS — G909 Disorder of the autonomic nervous system, unspecified: Secondary | ICD-10-CM | POA: Diagnosis not present

## 2014-01-15 DIAGNOSIS — E1149 Type 2 diabetes mellitus with other diabetic neurological complication: Secondary | ICD-10-CM | POA: Diagnosis not present

## 2014-01-15 DIAGNOSIS — IMO0001 Reserved for inherently not codable concepts without codable children: Secondary | ICD-10-CM | POA: Diagnosis not present

## 2014-01-15 DIAGNOSIS — H539 Unspecified visual disturbance: Secondary | ICD-10-CM | POA: Diagnosis not present

## 2014-01-20 DIAGNOSIS — H3530 Unspecified macular degeneration: Secondary | ICD-10-CM | POA: Diagnosis not present

## 2014-01-20 DIAGNOSIS — H539 Unspecified visual disturbance: Secondary | ICD-10-CM | POA: Diagnosis not present

## 2014-01-20 DIAGNOSIS — IMO0001 Reserved for inherently not codable concepts without codable children: Secondary | ICD-10-CM | POA: Diagnosis not present

## 2014-01-20 DIAGNOSIS — G909 Disorder of the autonomic nervous system, unspecified: Secondary | ICD-10-CM | POA: Diagnosis not present

## 2014-01-20 DIAGNOSIS — I10 Essential (primary) hypertension: Secondary | ICD-10-CM | POA: Diagnosis not present

## 2014-01-20 DIAGNOSIS — E1149 Type 2 diabetes mellitus with other diabetic neurological complication: Secondary | ICD-10-CM | POA: Diagnosis not present

## 2014-01-21 DIAGNOSIS — E1149 Type 2 diabetes mellitus with other diabetic neurological complication: Secondary | ICD-10-CM | POA: Diagnosis not present

## 2014-01-21 DIAGNOSIS — H3530 Unspecified macular degeneration: Secondary | ICD-10-CM | POA: Diagnosis not present

## 2014-01-21 DIAGNOSIS — H539 Unspecified visual disturbance: Secondary | ICD-10-CM | POA: Diagnosis not present

## 2014-01-21 DIAGNOSIS — IMO0001 Reserved for inherently not codable concepts without codable children: Secondary | ICD-10-CM | POA: Diagnosis not present

## 2014-01-21 DIAGNOSIS — I10 Essential (primary) hypertension: Secondary | ICD-10-CM | POA: Diagnosis not present

## 2014-01-21 DIAGNOSIS — G909 Disorder of the autonomic nervous system, unspecified: Secondary | ICD-10-CM | POA: Diagnosis not present

## 2014-01-22 DIAGNOSIS — I499 Cardiac arrhythmia, unspecified: Secondary | ICD-10-CM | POA: Diagnosis not present

## 2014-01-22 DIAGNOSIS — R0989 Other specified symptoms and signs involving the circulatory and respiratory systems: Secondary | ICD-10-CM | POA: Diagnosis not present

## 2014-01-22 DIAGNOSIS — N183 Chronic kidney disease, stage 3 unspecified: Secondary | ICD-10-CM | POA: Diagnosis not present

## 2014-01-22 DIAGNOSIS — G609 Hereditary and idiopathic neuropathy, unspecified: Secondary | ICD-10-CM | POA: Diagnosis not present

## 2014-01-22 DIAGNOSIS — I10 Essential (primary) hypertension: Secondary | ICD-10-CM | POA: Diagnosis not present

## 2014-01-22 DIAGNOSIS — R350 Frequency of micturition: Secondary | ICD-10-CM | POA: Diagnosis not present

## 2014-01-22 DIAGNOSIS — N400 Enlarged prostate without lower urinary tract symptoms: Secondary | ICD-10-CM | POA: Diagnosis not present

## 2014-01-22 DIAGNOSIS — R269 Unspecified abnormalities of gait and mobility: Secondary | ICD-10-CM | POA: Diagnosis not present

## 2014-01-22 DIAGNOSIS — Z9181 History of falling: Secondary | ICD-10-CM | POA: Diagnosis not present

## 2014-01-23 DIAGNOSIS — G909 Disorder of the autonomic nervous system, unspecified: Secondary | ICD-10-CM | POA: Diagnosis not present

## 2014-01-23 DIAGNOSIS — I10 Essential (primary) hypertension: Secondary | ICD-10-CM | POA: Diagnosis not present

## 2014-01-23 DIAGNOSIS — I69998 Other sequelae following unspecified cerebrovascular disease: Secondary | ICD-10-CM | POA: Diagnosis not present

## 2014-01-23 DIAGNOSIS — IMO0001 Reserved for inherently not codable concepts without codable children: Secondary | ICD-10-CM | POA: Diagnosis not present

## 2014-01-23 DIAGNOSIS — H3530 Unspecified macular degeneration: Secondary | ICD-10-CM | POA: Diagnosis not present

## 2014-01-23 DIAGNOSIS — E1149 Type 2 diabetes mellitus with other diabetic neurological complication: Secondary | ICD-10-CM | POA: Diagnosis not present

## 2014-01-23 DIAGNOSIS — H539 Unspecified visual disturbance: Secondary | ICD-10-CM | POA: Diagnosis not present

## 2014-01-27 DIAGNOSIS — I10 Essential (primary) hypertension: Secondary | ICD-10-CM | POA: Diagnosis not present

## 2014-01-27 DIAGNOSIS — IMO0001 Reserved for inherently not codable concepts without codable children: Secondary | ICD-10-CM | POA: Diagnosis not present

## 2014-01-27 DIAGNOSIS — H3530 Unspecified macular degeneration: Secondary | ICD-10-CM | POA: Diagnosis not present

## 2014-01-27 DIAGNOSIS — G909 Disorder of the autonomic nervous system, unspecified: Secondary | ICD-10-CM | POA: Diagnosis not present

## 2014-01-27 DIAGNOSIS — I69998 Other sequelae following unspecified cerebrovascular disease: Secondary | ICD-10-CM | POA: Diagnosis not present

## 2014-01-27 DIAGNOSIS — E1149 Type 2 diabetes mellitus with other diabetic neurological complication: Secondary | ICD-10-CM | POA: Diagnosis not present

## 2014-01-27 DIAGNOSIS — H539 Unspecified visual disturbance: Secondary | ICD-10-CM | POA: Diagnosis not present

## 2014-01-28 DIAGNOSIS — I69998 Other sequelae following unspecified cerebrovascular disease: Secondary | ICD-10-CM | POA: Diagnosis not present

## 2014-01-28 DIAGNOSIS — E1149 Type 2 diabetes mellitus with other diabetic neurological complication: Secondary | ICD-10-CM | POA: Diagnosis not present

## 2014-01-28 DIAGNOSIS — H3530 Unspecified macular degeneration: Secondary | ICD-10-CM | POA: Diagnosis not present

## 2014-01-28 DIAGNOSIS — H539 Unspecified visual disturbance: Secondary | ICD-10-CM | POA: Diagnosis not present

## 2014-01-28 DIAGNOSIS — IMO0001 Reserved for inherently not codable concepts without codable children: Secondary | ICD-10-CM | POA: Diagnosis not present

## 2014-01-29 DIAGNOSIS — I10 Essential (primary) hypertension: Secondary | ICD-10-CM | POA: Diagnosis not present

## 2014-01-29 DIAGNOSIS — IMO0001 Reserved for inherently not codable concepts without codable children: Secondary | ICD-10-CM | POA: Diagnosis not present

## 2014-01-29 DIAGNOSIS — H3530 Unspecified macular degeneration: Secondary | ICD-10-CM | POA: Diagnosis not present

## 2014-01-29 DIAGNOSIS — E1149 Type 2 diabetes mellitus with other diabetic neurological complication: Secondary | ICD-10-CM | POA: Diagnosis not present

## 2014-01-29 DIAGNOSIS — H539 Unspecified visual disturbance: Secondary | ICD-10-CM | POA: Diagnosis not present

## 2014-01-29 DIAGNOSIS — I69998 Other sequelae following unspecified cerebrovascular disease: Secondary | ICD-10-CM | POA: Diagnosis not present

## 2014-01-29 DIAGNOSIS — G909 Disorder of the autonomic nervous system, unspecified: Secondary | ICD-10-CM | POA: Diagnosis not present

## 2014-02-10 DIAGNOSIS — M546 Pain in thoracic spine: Secondary | ICD-10-CM | POA: Diagnosis not present

## 2014-02-10 DIAGNOSIS — I10 Essential (primary) hypertension: Secondary | ICD-10-CM | POA: Diagnosis not present

## 2014-02-10 DIAGNOSIS — R609 Edema, unspecified: Secondary | ICD-10-CM | POA: Diagnosis not present

## 2014-02-10 DIAGNOSIS — S20229A Contusion of unspecified back wall of thorax, initial encounter: Secondary | ICD-10-CM | POA: Diagnosis not present

## 2014-02-10 DIAGNOSIS — H8110 Benign paroxysmal vertigo, unspecified ear: Secondary | ICD-10-CM | POA: Diagnosis not present

## 2014-02-10 DIAGNOSIS — L02419 Cutaneous abscess of limb, unspecified: Secondary | ICD-10-CM | POA: Diagnosis not present

## 2014-02-10 DIAGNOSIS — G609 Hereditary and idiopathic neuropathy, unspecified: Secondary | ICD-10-CM | POA: Diagnosis not present

## 2014-02-10 DIAGNOSIS — R269 Unspecified abnormalities of gait and mobility: Secondary | ICD-10-CM | POA: Diagnosis not present

## 2014-02-12 ENCOUNTER — Other Ambulatory Visit: Payer: Self-pay | Admitting: Family Medicine

## 2014-02-12 ENCOUNTER — Ambulatory Visit
Admission: RE | Admit: 2014-02-12 | Discharge: 2014-02-12 | Disposition: A | Discharge status: Home / Self Care | Payer: Medicare Other | Source: Ambulatory Visit | Attending: Family Medicine | Admitting: Family Medicine

## 2014-02-12 DIAGNOSIS — M546 Pain in thoracic spine: Secondary | ICD-10-CM

## 2014-02-12 DIAGNOSIS — S20229A Contusion of unspecified back wall of thorax, initial encounter: Secondary | ICD-10-CM

## 2014-02-12 DIAGNOSIS — E119 Type 2 diabetes mellitus without complications: Secondary | ICD-10-CM | POA: Diagnosis not present

## 2014-02-12 DIAGNOSIS — H52229 Regular astigmatism, unspecified eye: Secondary | ICD-10-CM | POA: Diagnosis not present

## 2014-02-12 DIAGNOSIS — H47239 Glaucomatous optic atrophy, unspecified eye: Secondary | ICD-10-CM | POA: Diagnosis not present

## 2014-02-12 DIAGNOSIS — IMO0002 Reserved for concepts with insufficient information to code with codable children: Secondary | ICD-10-CM | POA: Diagnosis not present

## 2014-02-12 DIAGNOSIS — M545 Low back pain, unspecified: Secondary | ICD-10-CM | POA: Diagnosis not present

## 2014-02-12 DIAGNOSIS — J189 Pneumonia, unspecified organism: Secondary | ICD-10-CM

## 2014-02-12 DIAGNOSIS — H4010X0 Unspecified open-angle glaucoma, stage unspecified: Secondary | ICD-10-CM | POA: Diagnosis not present

## 2014-02-12 DIAGNOSIS — H5200 Hypermetropia, unspecified eye: Secondary | ICD-10-CM | POA: Diagnosis not present

## 2014-02-12 DIAGNOSIS — H353190 Nonexudative age-related macular degeneration, unspecified eye, stage unspecified: Secondary | ICD-10-CM | POA: Diagnosis not present

## 2014-02-13 DIAGNOSIS — K439 Ventral hernia without obstruction or gangrene: Secondary | ICD-10-CM | POA: Diagnosis not present

## 2014-02-13 DIAGNOSIS — M461 Sacroiliitis, not elsewhere classified: Secondary | ICD-10-CM | POA: Diagnosis not present

## 2014-02-13 DIAGNOSIS — J189 Pneumonia, unspecified organism: Secondary | ICD-10-CM | POA: Diagnosis not present

## 2014-02-18 ENCOUNTER — Ambulatory Visit: Payer: Medicare Other | Attending: Family Medicine | Admitting: Physical Therapy

## 2014-02-18 DIAGNOSIS — G589 Mononeuropathy, unspecified: Secondary | ICD-10-CM | POA: Insufficient documentation

## 2014-02-18 DIAGNOSIS — I10 Essential (primary) hypertension: Secondary | ICD-10-CM | POA: Insufficient documentation

## 2014-02-18 DIAGNOSIS — Z4789 Encounter for other orthopedic aftercare: Secondary | ICD-10-CM | POA: Insufficient documentation

## 2014-02-18 DIAGNOSIS — H8110 Benign paroxysmal vertigo, unspecified ear: Secondary | ICD-10-CM | POA: Diagnosis not present

## 2014-02-18 DIAGNOSIS — E119 Type 2 diabetes mellitus without complications: Secondary | ICD-10-CM | POA: Diagnosis not present

## 2014-02-18 DIAGNOSIS — R269 Unspecified abnormalities of gait and mobility: Secondary | ICD-10-CM | POA: Diagnosis not present

## 2014-02-18 DIAGNOSIS — Z8674 Personal history of sudden cardiac arrest: Secondary | ICD-10-CM | POA: Insufficient documentation

## 2014-02-23 ENCOUNTER — Ambulatory Visit: Payer: Medicare Other | Admitting: Physical Therapy

## 2014-02-23 DIAGNOSIS — H8110 Benign paroxysmal vertigo, unspecified ear: Secondary | ICD-10-CM | POA: Diagnosis not present

## 2014-02-23 DIAGNOSIS — R269 Unspecified abnormalities of gait and mobility: Secondary | ICD-10-CM | POA: Diagnosis not present

## 2014-02-23 DIAGNOSIS — Z4789 Encounter for other orthopedic aftercare: Secondary | ICD-10-CM | POA: Diagnosis not present

## 2014-02-23 DIAGNOSIS — E119 Type 2 diabetes mellitus without complications: Secondary | ICD-10-CM | POA: Diagnosis not present

## 2014-02-23 DIAGNOSIS — I10 Essential (primary) hypertension: Secondary | ICD-10-CM | POA: Diagnosis not present

## 2014-02-23 DIAGNOSIS — G589 Mononeuropathy, unspecified: Secondary | ICD-10-CM | POA: Diagnosis not present

## 2014-03-02 ENCOUNTER — Ambulatory Visit: Payer: Medicare Other | Attending: Family Medicine | Admitting: Physical Therapy

## 2014-03-02 DIAGNOSIS — Z4789 Encounter for other orthopedic aftercare: Secondary | ICD-10-CM | POA: Insufficient documentation

## 2014-03-02 DIAGNOSIS — Z8674 Personal history of sudden cardiac arrest: Secondary | ICD-10-CM | POA: Insufficient documentation

## 2014-03-02 DIAGNOSIS — R269 Unspecified abnormalities of gait and mobility: Secondary | ICD-10-CM | POA: Diagnosis not present

## 2014-03-02 DIAGNOSIS — I10 Essential (primary) hypertension: Secondary | ICD-10-CM | POA: Insufficient documentation

## 2014-03-02 DIAGNOSIS — G589 Mononeuropathy, unspecified: Secondary | ICD-10-CM | POA: Diagnosis not present

## 2014-03-02 DIAGNOSIS — H8110 Benign paroxysmal vertigo, unspecified ear: Secondary | ICD-10-CM | POA: Insufficient documentation

## 2014-03-02 DIAGNOSIS — E119 Type 2 diabetes mellitus without complications: Secondary | ICD-10-CM | POA: Diagnosis not present

## 2014-03-06 ENCOUNTER — Ambulatory Visit: Payer: Medicare Other | Admitting: Physical Therapy

## 2014-03-06 DIAGNOSIS — Z4789 Encounter for other orthopedic aftercare: Secondary | ICD-10-CM | POA: Diagnosis not present

## 2014-03-06 DIAGNOSIS — G589 Mononeuropathy, unspecified: Secondary | ICD-10-CM | POA: Diagnosis not present

## 2014-03-06 DIAGNOSIS — E119 Type 2 diabetes mellitus without complications: Secondary | ICD-10-CM | POA: Diagnosis not present

## 2014-03-06 DIAGNOSIS — H8110 Benign paroxysmal vertigo, unspecified ear: Secondary | ICD-10-CM | POA: Diagnosis not present

## 2014-03-06 DIAGNOSIS — R269 Unspecified abnormalities of gait and mobility: Secondary | ICD-10-CM | POA: Diagnosis not present

## 2014-03-06 DIAGNOSIS — I10 Essential (primary) hypertension: Secondary | ICD-10-CM | POA: Diagnosis not present

## 2014-03-09 ENCOUNTER — Ambulatory Visit: Payer: Medicare Other | Admitting: Physical Therapy

## 2014-03-09 ENCOUNTER — Other Ambulatory Visit: Payer: Self-pay | Admitting: Family Medicine

## 2014-03-09 DIAGNOSIS — R42 Dizziness and giddiness: Secondary | ICD-10-CM

## 2014-03-09 DIAGNOSIS — Z4789 Encounter for other orthopedic aftercare: Secondary | ICD-10-CM | POA: Diagnosis not present

## 2014-03-09 DIAGNOSIS — M48061 Spinal stenosis, lumbar region without neurogenic claudication: Secondary | ICD-10-CM

## 2014-03-09 DIAGNOSIS — R269 Unspecified abnormalities of gait and mobility: Secondary | ICD-10-CM

## 2014-03-09 DIAGNOSIS — H8110 Benign paroxysmal vertigo, unspecified ear: Secondary | ICD-10-CM | POA: Diagnosis not present

## 2014-03-09 DIAGNOSIS — I10 Essential (primary) hypertension: Secondary | ICD-10-CM | POA: Diagnosis not present

## 2014-03-09 DIAGNOSIS — E119 Type 2 diabetes mellitus without complications: Secondary | ICD-10-CM | POA: Diagnosis not present

## 2014-03-09 DIAGNOSIS — G589 Mononeuropathy, unspecified: Secondary | ICD-10-CM | POA: Diagnosis not present

## 2014-03-13 ENCOUNTER — Ambulatory Visit
Admission: RE | Admit: 2014-03-13 | Discharge: 2014-03-13 | Disposition: A | Discharge status: Home / Self Care | Payer: Medicare Other | Source: Ambulatory Visit | Attending: Family Medicine | Admitting: Family Medicine

## 2014-03-13 ENCOUNTER — Ambulatory Visit: Payer: Medicare Other | Admitting: Physical Therapy

## 2014-03-13 ENCOUNTER — Other Ambulatory Visit: Payer: Self-pay | Admitting: Family Medicine

## 2014-03-13 DIAGNOSIS — J90 Pleural effusion, not elsewhere classified: Secondary | ICD-10-CM | POA: Diagnosis not present

## 2014-03-13 DIAGNOSIS — E119 Type 2 diabetes mellitus without complications: Secondary | ICD-10-CM | POA: Diagnosis not present

## 2014-03-13 DIAGNOSIS — Z4789 Encounter for other orthopedic aftercare: Secondary | ICD-10-CM | POA: Diagnosis not present

## 2014-03-13 DIAGNOSIS — I10 Essential (primary) hypertension: Secondary | ICD-10-CM | POA: Diagnosis not present

## 2014-03-13 DIAGNOSIS — H8110 Benign paroxysmal vertigo, unspecified ear: Secondary | ICD-10-CM | POA: Diagnosis not present

## 2014-03-13 DIAGNOSIS — G589 Mononeuropathy, unspecified: Secondary | ICD-10-CM | POA: Diagnosis not present

## 2014-03-13 DIAGNOSIS — J189 Pneumonia, unspecified organism: Secondary | ICD-10-CM

## 2014-03-13 DIAGNOSIS — R269 Unspecified abnormalities of gait and mobility: Secondary | ICD-10-CM | POA: Diagnosis not present

## 2014-03-16 ENCOUNTER — Ambulatory Visit
Admission: RE | Admit: 2014-03-16 | Discharge: 2014-03-16 | Disposition: A | Discharge status: Home / Self Care | Payer: Medicare Other | Source: Ambulatory Visit | Attending: Family Medicine | Admitting: Family Medicine

## 2014-03-16 ENCOUNTER — Ambulatory Visit: Payer: Medicare Other | Admitting: Physical Therapy

## 2014-03-16 DIAGNOSIS — Z4789 Encounter for other orthopedic aftercare: Secondary | ICD-10-CM | POA: Diagnosis not present

## 2014-03-16 DIAGNOSIS — R42 Dizziness and giddiness: Secondary | ICD-10-CM

## 2014-03-16 DIAGNOSIS — M47817 Spondylosis without myelopathy or radiculopathy, lumbosacral region: Secondary | ICD-10-CM | POA: Diagnosis not present

## 2014-03-16 DIAGNOSIS — M48061 Spinal stenosis, lumbar region without neurogenic claudication: Secondary | ICD-10-CM

## 2014-03-16 DIAGNOSIS — R269 Unspecified abnormalities of gait and mobility: Secondary | ICD-10-CM | POA: Diagnosis not present

## 2014-03-16 DIAGNOSIS — M5126 Other intervertebral disc displacement, lumbar region: Secondary | ICD-10-CM | POA: Diagnosis not present

## 2014-03-16 DIAGNOSIS — M4120 Other idiopathic scoliosis, site unspecified: Secondary | ICD-10-CM | POA: Diagnosis not present

## 2014-03-16 DIAGNOSIS — G589 Mononeuropathy, unspecified: Secondary | ICD-10-CM | POA: Diagnosis not present

## 2014-03-16 DIAGNOSIS — I10 Essential (primary) hypertension: Secondary | ICD-10-CM | POA: Diagnosis not present

## 2014-03-16 DIAGNOSIS — E119 Type 2 diabetes mellitus without complications: Secondary | ICD-10-CM | POA: Diagnosis not present

## 2014-03-16 DIAGNOSIS — S0990XA Unspecified injury of head, initial encounter: Secondary | ICD-10-CM | POA: Diagnosis not present

## 2014-03-16 DIAGNOSIS — H8110 Benign paroxysmal vertigo, unspecified ear: Secondary | ICD-10-CM | POA: Diagnosis not present

## 2014-03-16 MED ORDER — GADOBENATE DIMEGLUMINE 529 MG/ML IV SOLN
20.0000 mL | Freq: Once | INTRAVENOUS | Status: AC | PRN
  Administered 2014-03-16: 20 mL via INTRAVENOUS

## 2014-03-20 ENCOUNTER — Encounter: Payer: Medicare Other | Admitting: Physical Therapy

## 2014-03-20 DIAGNOSIS — G609 Hereditary and idiopathic neuropathy, unspecified: Secondary | ICD-10-CM | POA: Diagnosis not present

## 2014-03-20 DIAGNOSIS — J90 Pleural effusion, not elsewhere classified: Secondary | ICD-10-CM | POA: Diagnosis not present

## 2014-03-20 DIAGNOSIS — I4949 Other premature depolarization: Secondary | ICD-10-CM | POA: Diagnosis not present

## 2014-03-20 DIAGNOSIS — R609 Edema, unspecified: Secondary | ICD-10-CM | POA: Diagnosis not present

## 2014-03-20 DIAGNOSIS — E119 Type 2 diabetes mellitus without complications: Secondary | ICD-10-CM | POA: Diagnosis not present

## 2014-03-20 DIAGNOSIS — I10 Essential (primary) hypertension: Secondary | ICD-10-CM | POA: Diagnosis not present

## 2014-03-20 DIAGNOSIS — I6990 Unspecified sequelae of unspecified cerebrovascular disease: Secondary | ICD-10-CM | POA: Diagnosis not present

## 2014-03-20 DIAGNOSIS — R269 Unspecified abnormalities of gait and mobility: Secondary | ICD-10-CM | POA: Diagnosis not present

## 2014-03-23 ENCOUNTER — Ambulatory Visit: Payer: Medicare Other | Admitting: Physical Therapy

## 2014-03-23 DIAGNOSIS — E119 Type 2 diabetes mellitus without complications: Secondary | ICD-10-CM | POA: Diagnosis not present

## 2014-03-23 DIAGNOSIS — G589 Mononeuropathy, unspecified: Secondary | ICD-10-CM | POA: Diagnosis not present

## 2014-03-23 DIAGNOSIS — I10 Essential (primary) hypertension: Secondary | ICD-10-CM | POA: Diagnosis not present

## 2014-03-23 DIAGNOSIS — R269 Unspecified abnormalities of gait and mobility: Secondary | ICD-10-CM | POA: Diagnosis not present

## 2014-03-23 DIAGNOSIS — Z4789 Encounter for other orthopedic aftercare: Secondary | ICD-10-CM | POA: Diagnosis not present

## 2014-03-23 DIAGNOSIS — H8110 Benign paroxysmal vertigo, unspecified ear: Secondary | ICD-10-CM | POA: Diagnosis not present

## 2014-03-24 DIAGNOSIS — R609 Edema, unspecified: Secondary | ICD-10-CM | POA: Diagnosis not present

## 2014-03-27 ENCOUNTER — Ambulatory Visit: Payer: Medicare Other | Admitting: Physical Therapy

## 2014-03-27 DIAGNOSIS — G589 Mononeuropathy, unspecified: Secondary | ICD-10-CM | POA: Diagnosis not present

## 2014-03-27 DIAGNOSIS — I10 Essential (primary) hypertension: Secondary | ICD-10-CM | POA: Diagnosis not present

## 2014-03-27 DIAGNOSIS — R269 Unspecified abnormalities of gait and mobility: Secondary | ICD-10-CM | POA: Diagnosis not present

## 2014-03-27 DIAGNOSIS — Z4789 Encounter for other orthopedic aftercare: Secondary | ICD-10-CM | POA: Diagnosis not present

## 2014-03-27 DIAGNOSIS — E119 Type 2 diabetes mellitus without complications: Secondary | ICD-10-CM | POA: Diagnosis not present

## 2014-03-27 DIAGNOSIS — H8110 Benign paroxysmal vertigo, unspecified ear: Secondary | ICD-10-CM | POA: Diagnosis not present

## 2014-04-01 ENCOUNTER — Other Ambulatory Visit: Payer: Self-pay | Admitting: Cardiology

## 2014-04-01 ENCOUNTER — Ambulatory Visit: Payer: Medicare Other | Attending: Family Medicine | Admitting: Physical Therapy

## 2014-04-01 DIAGNOSIS — R269 Unspecified abnormalities of gait and mobility: Secondary | ICD-10-CM | POA: Insufficient documentation

## 2014-04-01 DIAGNOSIS — G589 Mononeuropathy, unspecified: Secondary | ICD-10-CM | POA: Insufficient documentation

## 2014-04-01 DIAGNOSIS — H8110 Benign paroxysmal vertigo, unspecified ear: Secondary | ICD-10-CM | POA: Diagnosis not present

## 2014-04-01 DIAGNOSIS — Z4789 Encounter for other orthopedic aftercare: Secondary | ICD-10-CM | POA: Diagnosis not present

## 2014-04-01 DIAGNOSIS — I10 Essential (primary) hypertension: Secondary | ICD-10-CM | POA: Insufficient documentation

## 2014-04-01 DIAGNOSIS — E119 Type 2 diabetes mellitus without complications: Secondary | ICD-10-CM | POA: Diagnosis not present

## 2014-04-01 DIAGNOSIS — Z8674 Personal history of sudden cardiac arrest: Secondary | ICD-10-CM | POA: Diagnosis not present

## 2014-04-08 ENCOUNTER — Ambulatory Visit: Payer: Medicare Other | Admitting: Physical Therapy

## 2014-04-08 DIAGNOSIS — E119 Type 2 diabetes mellitus without complications: Secondary | ICD-10-CM | POA: Diagnosis not present

## 2014-04-08 DIAGNOSIS — H8110 Benign paroxysmal vertigo, unspecified ear: Secondary | ICD-10-CM | POA: Diagnosis not present

## 2014-04-08 DIAGNOSIS — I10 Essential (primary) hypertension: Secondary | ICD-10-CM | POA: Diagnosis not present

## 2014-04-08 DIAGNOSIS — Z4789 Encounter for other orthopedic aftercare: Secondary | ICD-10-CM | POA: Diagnosis not present

## 2014-04-08 DIAGNOSIS — R269 Unspecified abnormalities of gait and mobility: Secondary | ICD-10-CM | POA: Diagnosis not present

## 2014-04-08 DIAGNOSIS — G589 Mononeuropathy, unspecified: Secondary | ICD-10-CM | POA: Diagnosis not present

## 2014-04-10 ENCOUNTER — Ambulatory Visit: Payer: Medicare Other | Admitting: Physical Therapy

## 2014-04-10 DIAGNOSIS — E119 Type 2 diabetes mellitus without complications: Secondary | ICD-10-CM | POA: Diagnosis not present

## 2014-04-10 DIAGNOSIS — G589 Mononeuropathy, unspecified: Secondary | ICD-10-CM | POA: Diagnosis not present

## 2014-04-10 DIAGNOSIS — I10 Essential (primary) hypertension: Secondary | ICD-10-CM | POA: Diagnosis not present

## 2014-04-10 DIAGNOSIS — H8110 Benign paroxysmal vertigo, unspecified ear: Secondary | ICD-10-CM | POA: Diagnosis not present

## 2014-04-10 DIAGNOSIS — R269 Unspecified abnormalities of gait and mobility: Secondary | ICD-10-CM | POA: Diagnosis not present

## 2014-04-10 DIAGNOSIS — Z4789 Encounter for other orthopedic aftercare: Secondary | ICD-10-CM | POA: Diagnosis not present

## 2014-04-14 ENCOUNTER — Ambulatory Visit: Payer: Medicare Other | Admitting: Physical Therapy

## 2014-04-14 DIAGNOSIS — E119 Type 2 diabetes mellitus without complications: Secondary | ICD-10-CM | POA: Diagnosis not present

## 2014-04-14 DIAGNOSIS — Z4789 Encounter for other orthopedic aftercare: Secondary | ICD-10-CM | POA: Diagnosis not present

## 2014-04-14 DIAGNOSIS — H8110 Benign paroxysmal vertigo, unspecified ear: Secondary | ICD-10-CM | POA: Diagnosis not present

## 2014-04-14 DIAGNOSIS — R269 Unspecified abnormalities of gait and mobility: Secondary | ICD-10-CM | POA: Diagnosis not present

## 2014-04-14 DIAGNOSIS — I10 Essential (primary) hypertension: Secondary | ICD-10-CM | POA: Diagnosis not present

## 2014-04-14 DIAGNOSIS — G589 Mononeuropathy, unspecified: Secondary | ICD-10-CM | POA: Diagnosis not present

## 2014-04-17 ENCOUNTER — Ambulatory Visit: Payer: Medicare Other | Admitting: Physical Therapy

## 2014-04-17 DIAGNOSIS — I10 Essential (primary) hypertension: Secondary | ICD-10-CM | POA: Diagnosis not present

## 2014-04-17 DIAGNOSIS — Z4789 Encounter for other orthopedic aftercare: Secondary | ICD-10-CM | POA: Diagnosis not present

## 2014-04-17 DIAGNOSIS — R269 Unspecified abnormalities of gait and mobility: Secondary | ICD-10-CM | POA: Diagnosis not present

## 2014-04-17 DIAGNOSIS — E119 Type 2 diabetes mellitus without complications: Secondary | ICD-10-CM | POA: Diagnosis not present

## 2014-04-17 DIAGNOSIS — H8110 Benign paroxysmal vertigo, unspecified ear: Secondary | ICD-10-CM | POA: Diagnosis not present

## 2014-04-17 DIAGNOSIS — G589 Mononeuropathy, unspecified: Secondary | ICD-10-CM | POA: Diagnosis not present

## 2014-04-20 ENCOUNTER — Encounter: Payer: Medicare Other | Admitting: Physical Therapy

## 2014-04-21 ENCOUNTER — Encounter: Payer: Medicare Other | Admitting: Physical Therapy

## 2014-04-24 ENCOUNTER — Ambulatory Visit: Payer: Medicare Other | Admitting: Physical Therapy

## 2014-04-24 ENCOUNTER — Encounter: Payer: Medicare Other | Admitting: Physical Therapy

## 2014-04-24 DIAGNOSIS — G589 Mononeuropathy, unspecified: Secondary | ICD-10-CM | POA: Diagnosis not present

## 2014-04-24 DIAGNOSIS — I10 Essential (primary) hypertension: Secondary | ICD-10-CM | POA: Diagnosis not present

## 2014-04-24 DIAGNOSIS — Z4789 Encounter for other orthopedic aftercare: Secondary | ICD-10-CM | POA: Diagnosis not present

## 2014-04-24 DIAGNOSIS — H8110 Benign paroxysmal vertigo, unspecified ear: Secondary | ICD-10-CM | POA: Diagnosis not present

## 2014-04-24 DIAGNOSIS — E119 Type 2 diabetes mellitus without complications: Secondary | ICD-10-CM | POA: Diagnosis not present

## 2014-04-24 DIAGNOSIS — R269 Unspecified abnormalities of gait and mobility: Secondary | ICD-10-CM | POA: Diagnosis not present

## 2014-04-27 ENCOUNTER — Ambulatory Visit: Payer: Medicare Other | Admitting: Physical Therapy

## 2014-04-27 DIAGNOSIS — R269 Unspecified abnormalities of gait and mobility: Secondary | ICD-10-CM | POA: Diagnosis not present

## 2014-04-27 DIAGNOSIS — I10 Essential (primary) hypertension: Secondary | ICD-10-CM | POA: Diagnosis not present

## 2014-04-27 DIAGNOSIS — E119 Type 2 diabetes mellitus without complications: Secondary | ICD-10-CM | POA: Diagnosis not present

## 2014-04-27 DIAGNOSIS — Z4789 Encounter for other orthopedic aftercare: Secondary | ICD-10-CM | POA: Diagnosis not present

## 2014-04-27 DIAGNOSIS — G589 Mononeuropathy, unspecified: Secondary | ICD-10-CM | POA: Diagnosis not present

## 2014-04-27 DIAGNOSIS — H8110 Benign paroxysmal vertigo, unspecified ear: Secondary | ICD-10-CM | POA: Diagnosis not present

## 2014-04-28 ENCOUNTER — Encounter: Payer: Medicare Other | Admitting: Physical Therapy

## 2014-04-30 DIAGNOSIS — D1801 Hemangioma of skin and subcutaneous tissue: Secondary | ICD-10-CM | POA: Diagnosis not present

## 2014-04-30 DIAGNOSIS — C44621 Squamous cell carcinoma of skin of unspecified upper limb, including shoulder: Secondary | ICD-10-CM | POA: Diagnosis not present

## 2014-04-30 DIAGNOSIS — D1739 Benign lipomatous neoplasm of skin and subcutaneous tissue of other sites: Secondary | ICD-10-CM | POA: Diagnosis not present

## 2014-04-30 DIAGNOSIS — D485 Neoplasm of uncertain behavior of skin: Secondary | ICD-10-CM | POA: Diagnosis not present

## 2014-04-30 DIAGNOSIS — L821 Other seborrheic keratosis: Secondary | ICD-10-CM | POA: Diagnosis not present

## 2014-05-01 ENCOUNTER — Ambulatory Visit: Payer: Medicare Other | Attending: Family Medicine | Admitting: Physical Therapy

## 2014-05-01 ENCOUNTER — Encounter: Payer: Medicare Other | Admitting: Physical Therapy

## 2014-05-01 ENCOUNTER — Other Ambulatory Visit: Payer: Self-pay | Admitting: Family Medicine

## 2014-05-01 ENCOUNTER — Ambulatory Visit
Admission: RE | Admit: 2014-05-01 | Discharge: 2014-05-01 | Disposition: A | Discharge status: Home / Self Care | Payer: Medicare Other | Source: Ambulatory Visit | Attending: Family Medicine | Admitting: Family Medicine

## 2014-05-01 DIAGNOSIS — J90 Pleural effusion, not elsewhere classified: Secondary | ICD-10-CM | POA: Diagnosis not present

## 2014-05-01 DIAGNOSIS — E119 Type 2 diabetes mellitus without complications: Secondary | ICD-10-CM | POA: Insufficient documentation

## 2014-05-01 DIAGNOSIS — I10 Essential (primary) hypertension: Secondary | ICD-10-CM | POA: Insufficient documentation

## 2014-05-01 DIAGNOSIS — J189 Pneumonia, unspecified organism: Secondary | ICD-10-CM | POA: Diagnosis not present

## 2014-05-01 DIAGNOSIS — H8110 Benign paroxysmal vertigo, unspecified ear: Secondary | ICD-10-CM | POA: Diagnosis not present

## 2014-05-01 DIAGNOSIS — Z4789 Encounter for other orthopedic aftercare: Secondary | ICD-10-CM | POA: Diagnosis not present

## 2014-05-01 DIAGNOSIS — R269 Unspecified abnormalities of gait and mobility: Secondary | ICD-10-CM | POA: Insufficient documentation

## 2014-05-01 DIAGNOSIS — G589 Mononeuropathy, unspecified: Secondary | ICD-10-CM | POA: Insufficient documentation

## 2014-05-01 DIAGNOSIS — Z8674 Personal history of sudden cardiac arrest: Secondary | ICD-10-CM | POA: Insufficient documentation

## 2014-05-05 ENCOUNTER — Ambulatory Visit: Payer: Medicare Other | Admitting: Physical Therapy

## 2014-05-05 DIAGNOSIS — G589 Mononeuropathy, unspecified: Secondary | ICD-10-CM | POA: Diagnosis not present

## 2014-05-05 DIAGNOSIS — G609 Hereditary and idiopathic neuropathy, unspecified: Secondary | ICD-10-CM | POA: Diagnosis not present

## 2014-05-05 DIAGNOSIS — R269 Unspecified abnormalities of gait and mobility: Secondary | ICD-10-CM | POA: Diagnosis not present

## 2014-05-05 DIAGNOSIS — J90 Pleural effusion, not elsewhere classified: Secondary | ICD-10-CM | POA: Diagnosis not present

## 2014-05-05 DIAGNOSIS — Z4789 Encounter for other orthopedic aftercare: Secondary | ICD-10-CM | POA: Diagnosis not present

## 2014-05-05 DIAGNOSIS — R609 Edema, unspecified: Secondary | ICD-10-CM | POA: Diagnosis not present

## 2014-05-05 DIAGNOSIS — E119 Type 2 diabetes mellitus without complications: Secondary | ICD-10-CM | POA: Diagnosis not present

## 2014-05-05 DIAGNOSIS — I10 Essential (primary) hypertension: Secondary | ICD-10-CM | POA: Diagnosis not present

## 2014-05-05 DIAGNOSIS — H8110 Benign paroxysmal vertigo, unspecified ear: Secondary | ICD-10-CM | POA: Diagnosis not present

## 2014-05-06 ENCOUNTER — Other Ambulatory Visit: Payer: Self-pay | Admitting: Family Medicine

## 2014-05-06 DIAGNOSIS — J90 Pleural effusion, not elsewhere classified: Secondary | ICD-10-CM

## 2014-05-07 ENCOUNTER — Ambulatory Visit: Payer: Medicare Other | Admitting: Physical Therapy

## 2014-05-07 DIAGNOSIS — E119 Type 2 diabetes mellitus without complications: Secondary | ICD-10-CM | POA: Diagnosis not present

## 2014-05-07 DIAGNOSIS — G589 Mononeuropathy, unspecified: Secondary | ICD-10-CM | POA: Diagnosis not present

## 2014-05-07 DIAGNOSIS — H8110 Benign paroxysmal vertigo, unspecified ear: Secondary | ICD-10-CM | POA: Diagnosis not present

## 2014-05-07 DIAGNOSIS — R269 Unspecified abnormalities of gait and mobility: Secondary | ICD-10-CM | POA: Diagnosis not present

## 2014-05-07 DIAGNOSIS — Z4789 Encounter for other orthopedic aftercare: Secondary | ICD-10-CM | POA: Diagnosis not present

## 2014-05-07 DIAGNOSIS — I10 Essential (primary) hypertension: Secondary | ICD-10-CM | POA: Diagnosis not present

## 2014-05-08 ENCOUNTER — Other Ambulatory Visit: Payer: Self-pay | Admitting: *Deleted

## 2014-05-08 MED ORDER — ATORVASTATIN CALCIUM 10 MG PO TABS: 10.0000 mg | ORAL_TABLET | Freq: Every day | ORAL | Status: DC

## 2014-05-11 ENCOUNTER — Ambulatory Visit: Payer: Medicare Other | Admitting: Physical Therapy

## 2014-05-11 DIAGNOSIS — H8110 Benign paroxysmal vertigo, unspecified ear: Secondary | ICD-10-CM | POA: Diagnosis not present

## 2014-05-11 DIAGNOSIS — R269 Unspecified abnormalities of gait and mobility: Secondary | ICD-10-CM | POA: Diagnosis not present

## 2014-05-11 DIAGNOSIS — E119 Type 2 diabetes mellitus without complications: Secondary | ICD-10-CM | POA: Diagnosis not present

## 2014-05-11 DIAGNOSIS — I10 Essential (primary) hypertension: Secondary | ICD-10-CM | POA: Diagnosis not present

## 2014-05-11 DIAGNOSIS — Z4789 Encounter for other orthopedic aftercare: Secondary | ICD-10-CM | POA: Diagnosis not present

## 2014-05-11 DIAGNOSIS — G589 Mononeuropathy, unspecified: Secondary | ICD-10-CM | POA: Diagnosis not present

## 2014-05-14 ENCOUNTER — Ambulatory Visit
Admission: RE | Admit: 2014-05-14 | Discharge: 2014-05-14 | Disposition: A | Discharge status: Home / Self Care | Payer: Medicare Other | Source: Ambulatory Visit | Attending: Family Medicine | Admitting: Family Medicine

## 2014-05-14 DIAGNOSIS — J479 Bronchiectasis, uncomplicated: Secondary | ICD-10-CM | POA: Diagnosis not present

## 2014-05-14 DIAGNOSIS — R918 Other nonspecific abnormal finding of lung field: Secondary | ICD-10-CM | POA: Diagnosis not present

## 2014-05-14 DIAGNOSIS — I2510 Atherosclerotic heart disease of native coronary artery without angina pectoris: Secondary | ICD-10-CM | POA: Diagnosis not present

## 2014-05-14 DIAGNOSIS — J90 Pleural effusion, not elsewhere classified: Secondary | ICD-10-CM

## 2014-05-14 MED ORDER — IOHEXOL 300 MG/ML  SOLN
75.0000 mL | Freq: Once | INTRAMUSCULAR | Status: AC | PRN
  Administered 2014-05-14: 75 mL via INTRAVENOUS

## 2014-05-15 ENCOUNTER — Encounter: Payer: Medicare Other | Admitting: Physical Therapy

## 2014-05-22 DIAGNOSIS — Z23 Encounter for immunization: Secondary | ICD-10-CM | POA: Diagnosis not present

## 2014-05-22 DIAGNOSIS — R269 Unspecified abnormalities of gait and mobility: Secondary | ICD-10-CM | POA: Diagnosis not present

## 2014-05-22 DIAGNOSIS — N183 Chronic kidney disease, stage 3 unspecified: Secondary | ICD-10-CM | POA: Diagnosis not present

## 2014-05-22 DIAGNOSIS — I10 Essential (primary) hypertension: Secondary | ICD-10-CM | POA: Diagnosis not present

## 2014-05-22 DIAGNOSIS — G609 Hereditary and idiopathic neuropathy, unspecified: Secondary | ICD-10-CM | POA: Diagnosis not present

## 2014-05-22 DIAGNOSIS — I129 Hypertensive chronic kidney disease with stage 1 through stage 4 chronic kidney disease, or unspecified chronic kidney disease: Secondary | ICD-10-CM | POA: Diagnosis not present

## 2014-05-22 DIAGNOSIS — R609 Edema, unspecified: Secondary | ICD-10-CM | POA: Diagnosis not present

## 2014-05-25 ENCOUNTER — Ambulatory Visit (INDEPENDENT_AMBULATORY_CARE_PROVIDER_SITE_OTHER): Payer: Medicare Other | Admitting: Internal Medicine

## 2014-05-25 ENCOUNTER — Encounter: Payer: Self-pay | Admitting: Internal Medicine

## 2014-05-25 ENCOUNTER — Other Ambulatory Visit (INDEPENDENT_AMBULATORY_CARE_PROVIDER_SITE_OTHER): Payer: Medicare Other

## 2014-05-25 VITALS — BP 132/66 | HR 61 | Temp 97.5°F | Ht 70.5 in | Wt 217.0 lb

## 2014-05-25 DIAGNOSIS — J90 Pleural effusion, not elsewhere classified: Secondary | ICD-10-CM

## 2014-05-25 DIAGNOSIS — R0609 Other forms of dyspnea: Secondary | ICD-10-CM

## 2014-05-25 DIAGNOSIS — R0989 Other specified symptoms and signs involving the circulatory and respiratory systems: Secondary | ICD-10-CM | POA: Diagnosis not present

## 2014-05-25 DIAGNOSIS — R05 Cough: Secondary | ICD-10-CM | POA: Diagnosis not present

## 2014-05-25 DIAGNOSIS — R058 Other specified cough: Secondary | ICD-10-CM

## 2014-05-25 DIAGNOSIS — R059 Cough, unspecified: Secondary | ICD-10-CM

## 2014-05-25 DIAGNOSIS — R0600 Dyspnea, unspecified: Secondary | ICD-10-CM | POA: Insufficient documentation

## 2014-05-25 LAB — CBC WITH DIFFERENTIAL/PLATELET
BASOS PCT: 0.4 % (ref 0.0–3.0)
Basophils Absolute: 0 10*3/uL (ref 0.0–0.1)
EOS ABS: 0.3 10*3/uL (ref 0.0–0.7)
Eosinophils Relative: 3.2 % (ref 0.0–5.0)
HEMATOCRIT: 40.5 % (ref 39.0–52.0)
HEMOGLOBIN: 13.7 g/dL (ref 13.0–17.0)
LYMPHS PCT: 18.6 % (ref 12.0–46.0)
Lymphs Abs: 1.8 10*3/uL (ref 0.7–4.0)
MCHC: 34 g/dL (ref 30.0–36.0)
MCV: 96 fl (ref 78.0–100.0)
MONOS PCT: 9.8 % (ref 3.0–12.0)
Monocytes Absolute: 0.9 10*3/uL (ref 0.1–1.0)
NEUTROS ABS: 6.6 10*3/uL (ref 1.4–7.7)
Neutrophils Relative %: 68 % (ref 43.0–77.0)
Platelets: 217 10*3/uL (ref 150.0–400.0)
RBC: 4.21 Mil/uL — AB (ref 4.22–5.81)
RDW: 14 % (ref 11.5–15.5)
WBC: 9.7 10*3/uL (ref 4.0–10.5)

## 2014-05-25 LAB — BASIC METABOLIC PANEL
BUN: 17 mg/dL (ref 6–23)
CHLORIDE: 103 meq/L (ref 96–112)
CO2: 25 meq/L (ref 19–32)
Calcium: 8.9 mg/dL (ref 8.4–10.5)
Creatinine, Ser: 1.1 mg/dL (ref 0.4–1.5)
GFR: 67.12 mL/min (ref >60.00)
Glucose, Bld: 92 mg/dL (ref 70–99)
Potassium: 3.7 mEq/L (ref 3.5–5.1)
Sodium: 138 mEq/L (ref 135–145)

## 2014-05-25 LAB — SEDIMENTATION RATE: SED RATE: 45 mm/h — AB (ref 0–22)

## 2014-05-25 LAB — TSH: TSH: 1.01 u[IU]/mL (ref 0.35–4.50)

## 2014-05-25 LAB — BRAIN NATRIURETIC PEPTIDE: Pro B Natriuretic peptide (BNP): 214 pg/mL — ABNORMAL HIGH (ref 0.0–100.0)

## 2014-05-25 NOTE — Progress Notes (Signed)
Subjective:    Patient ID: Timothy Cooper, male    DOB: 1923/11/07  MRN: [id number]  HPI  78 yowm never smoker acutely ill with cough/ runny eyes / low grade fever with patchy LLL as dz/ effusion on cxr 01/03/14 with persistent L effusion / partially loculated by CT 05/14/14 so referred to pulmonary clinic 05/25/2014 by Carlos Levering [facility]   05/25/2014 1st [facility] office visit/ Wert   Chief Complaint  Patient presents with  . Pulmonary Consult    Referred by Dr. Carlos Levering for eval of abn CT Chest. Pt c/o DOE when gets in a hurry, but not limited by SOB and able to do what he wants. He had PNA approx 3 months ago.   minimal chronic cough / throat clearing s mucus production on vasotec  No obvious  day to day or daytime variabilty or assoc  cp or chest tightness, subjective wheeze overt sinus or hb symptoms. No unusual exp hx or h/o childhood pna/ asthma or knowledge of premature birth.  Sleeping ok without nocturnal  or early am exacerbation  of respiratory  c/o's or need for noct saba. Also denies any obvious fluctuation of symptoms with weather or environmental changes or other aggravating or alleviating factors except as outlined above   Current Medications, Allergies, Complete Past Medical History, Past Surgical History, Family History, and Social History were reviewed in [employer] record.           Review of Systems  Constitutional: Negative for fever, chills, activity change, appetite change and unexpected weight change.  HENT: Negative for congestion, dental problem, postnasal drip, rhinorrhea, sneezing, sore throat, trouble swallowing and voice change.   Eyes: Negative for visual disturbance.  Respiratory: Negative for cough, choking and shortness of breath.   Cardiovascular: Negative for chest pain and leg swelling.  Gastrointestinal: Negative for nausea, vomiting and abdominal pain.  Genitourinary: Negative for  difficulty urinating.  Musculoskeletal: Negative for arthralgias.  Skin: Negative for rash.  Psychiatric/Behavioral: Negative for behavioral problems and confusion.       Objective:   Physical Exam  Wt Readings from Last 3 Encounters:  05/25/14 217 lb (98.431 kg)  01/03/14 218 lb (98.884 kg)  11/27/13 222 lb (100.699 kg)      amb wm nad   HEENT: nl dentition, turbinates, and orophanx. Nl external ear canals without cough reflex   NECK :  without JVD/Nodes/TM/ nl carotid upstrokes bilaterally   LUNGS: no acc muscle use, min dullness at L base    CV:  RRR  no s3 or murmur or increase in P2, 1+ pitting bilateral edema   ABD:  soft and nontender with nl excursion in the supine position. No bruits or organomegaly, bowel sounds nl  MS:  warm without deformities, calf tenderness, cyanosis or clubbing  SKIN: warm and dry without lesions    NEURO:  alert, approp, no deficits   05/14/14 CT chest Moderate effusion on the left with primarily free-flowing but mild  loculated components is well. Posterior left base consolidation.  Areas of parenchymal lung scarring bilaterally.  Areas of bronchiectatic change, most notably in the left lower lobe.  5 mm nodular opacity in the posterior segment right upper lobe.  Followup of this nodular opacity should be based on Fleischner  Society guidelines       Recent Labs Lab 05/25/14 1220  NA 138  K 3.7  CL 103  CO2 25  BUN 17  CREATININE 1.1  GLUCOSE 92    Recent Labs Lab 05/25/14 1220  HGB 13.7  HCT 40.5  WBC 9.7  PLT 217.0     Lab Results  Component Value Date   TSH 1.01 05/25/2014     Lab Results  Component Value Date   PROBNP 214.0* 05/25/2014     Lab Results  Component Value Date   ESRSEDRATE 45* 05/25/2014           Assessment & Plan:

## 2014-05-25 NOTE — Patient Instructions (Signed)
Please remember to go to the lab department downstairs for your tests - we will call you with the results when they are available.  Please schedule a follow up office visit in 6 weeks, call sooner if needed

## 2014-05-26 DIAGNOSIS — R05 Cough: Secondary | ICD-10-CM | POA: Insufficient documentation

## 2014-05-26 DIAGNOSIS — R058 Other specified cough: Secondary | ICD-10-CM | POA: Insufficient documentation

## 2014-05-26 DIAGNOSIS — J90 Pleural effusion, not elsewhere classified: Secondary | ICD-10-CM | POA: Insufficient documentation

## 2014-05-26 NOTE — Progress Notes (Signed)
Quick Note:  Spoke with pt and notified of results per Dr. Wert. Pt verbalized understanding and denied any questions.  ______ 

## 2014-05-26 NOTE — Assessment & Plan Note (Signed)
He is currently on acei and the cough preceded the apparent CAP so likely the cause but not enough of a problem at this point to warrant changing to ARB, advised patient accordingly  See instructions for specific recommendations which were reviewed directly with the patient who was given a copy with highlighter outlining the key components.

## 2014-05-26 NOTE — Assessment & Plan Note (Signed)
Concerned about low grade diastolic dysfunction contributing to peripheral edema at this point but not to the small L sided effusion which would not be typical of chf

## 2014-05-26 NOTE — Assessment & Plan Note (Signed)
Onset with probable CAP 01/03/14  See CT chest 05/14/14 ESR = 45 05/25/14   Classic late parapneumonic L effusion with loculations and likely mostly "organized" at this point and therefore with little benefit in terms of attempting thoracentesis - would only attempt if symptoms of chronic doe or infection develop at this point  Discussed in detail all the  indications, usual  risks and alternatives  relative to the benefits with patient who agrees to proceed with conservative rx only .

## 2014-05-27 ENCOUNTER — Other Ambulatory Visit: Payer: Self-pay | Admitting: Cardiology

## 2014-06-08 DIAGNOSIS — E1151 Type 2 diabetes mellitus with diabetic peripheral angiopathy without gangrene: Secondary | ICD-10-CM | POA: Diagnosis not present

## 2014-06-08 DIAGNOSIS — I739 Peripheral vascular disease, unspecified: Secondary | ICD-10-CM | POA: Diagnosis not present

## 2014-06-08 DIAGNOSIS — L603 Nail dystrophy: Secondary | ICD-10-CM | POA: Diagnosis not present

## 2014-06-08 DIAGNOSIS — M79609 Pain in unspecified limb: Secondary | ICD-10-CM | POA: Diagnosis not present

## 2014-06-15 DIAGNOSIS — H4011X0 Primary open-angle glaucoma, stage unspecified: Secondary | ICD-10-CM | POA: Diagnosis not present

## 2014-06-15 DIAGNOSIS — H3530 Unspecified macular degeneration: Secondary | ICD-10-CM | POA: Diagnosis not present

## 2014-06-18 DIAGNOSIS — E539 Vitamin B deficiency, unspecified: Secondary | ICD-10-CM | POA: Diagnosis not present

## 2014-06-18 DIAGNOSIS — M7989 Other specified soft tissue disorders: Secondary | ICD-10-CM | POA: Diagnosis not present

## 2014-06-18 DIAGNOSIS — M2040 Other hammer toe(s) (acquired), unspecified foot: Secondary | ICD-10-CM | POA: Diagnosis not present

## 2014-06-18 DIAGNOSIS — E0840 Diabetes mellitus due to underlying condition with diabetic neuropathy, unspecified: Secondary | ICD-10-CM | POA: Diagnosis not present

## 2014-06-23 ENCOUNTER — Ambulatory Visit: Payer: Medicare Other | Admitting: Cardiology

## 2014-07-06 ENCOUNTER — Ambulatory Visit: Payer: Medicare Other | Admitting: Internal Medicine

## 2014-07-08 ENCOUNTER — Ambulatory Visit (INDEPENDENT_AMBULATORY_CARE_PROVIDER_SITE_OTHER)
Admission: RE | Admit: 2014-07-08 | Discharge: 2014-07-08 | Disposition: A | Discharge status: Home / Self Care | Payer: Medicare Other | Source: Ambulatory Visit | Attending: Internal Medicine | Admitting: Internal Medicine

## 2014-07-08 ENCOUNTER — Encounter: Payer: Self-pay | Admitting: Internal Medicine

## 2014-07-08 ENCOUNTER — Encounter (INDEPENDENT_AMBULATORY_CARE_PROVIDER_SITE_OTHER): Payer: Self-pay

## 2014-07-08 ENCOUNTER — Ambulatory Visit (INDEPENDENT_AMBULATORY_CARE_PROVIDER_SITE_OTHER): Payer: Medicare Other | Admitting: Internal Medicine

## 2014-07-08 DIAGNOSIS — J90 Pleural effusion, not elsewhere classified: Secondary | ICD-10-CM

## 2014-07-08 NOTE — Progress Notes (Addendum)
Subjective:    Patient ID: Timothy Cooper, male    DOB: [birth year]  MRN: [id number]    Brief patient profile:  56 yowm never smoker acutely ill with cough/ runny eyes / low grade fever with patchy LLL as dz/ effusion on cxr 01/03/14 with persistent L effusion / partially loculated by CT 05/14/14 so referred to pulmonary clinic 05/25/2014 by Carlos Levering PA [facility]   History of Present Illness  05/25/2014 1st [place] [facility] office visit/ Kiyara Bouffard   Chief Complaint  Patient presents with  . Pulmonary Consult    Referred by Dr. Carlos Levering for eval of abn CT Chest. Pt c/o DOE when gets in a hurry, but not limited by SOB and able to do what he wants. He had PNA approx 3 months ago.   minimal chronic cough / throat clearing s mucus production on vasotec rec Conservative rx for what is likely very late phase parapneumonic effusion   07/10/2014 f/u ov/Lashea Goda re: L effusion Chief Complaint  Patient presents with  . Follow-up    Pt states that his breathing is doing well and denies any new co's today.     Not limited by breathing from desired activities    No obvious day to day or daytime variabilty or assoc chronic cough or cp or chest tightness, subjective wheeze overt sinus or hb symptoms. No unusual exp hx or h/o childhood pna/ asthma or knowledge of premature birth.  Sleeping ok without nocturnal  or early am exacerbation  of respiratory  c/o's or need for noct saba. Also denies any obvious fluctuation of symptoms with weather or environmental changes or other aggravating or alleviating factors except as outlined above   Current Medications, Allergies, Complete Past Medical History, Past Surgical History, Family History, and Social History were reviewed in [employer] record.  ROS  The following are not active complaints unless bolded sore throat, dysphagia, dental problems, itching, sneezing,  nasal congestion or excess/ purulent secretions, ear ache,    fever, chills, sweats, unintended wt loss, pleuritic or exertional cp, hemoptysis,  orthopnea pnd or leg swelling, presyncope, palpitations, heartburn, abdominal pain, anorexia, nausea, vomiting, diarrhea  or change in bowel or urinary habits, change in stools or urine, dysuria,hematuria,  rash, arthralgias, visual complaints, headache, numbness weakness or ataxia or problems with walking or coordination,  change in mood/affect or memory.                          Objective:   Physical Exam  07/08/2014      216  Wt Readings from Last 3 Encounters:  05/25/14 217 lb (98.431 kg)  01/03/14 218 lb (98.884 kg)  11/27/13 222 lb (100.699 kg)      amb wm nad   HEENT: nl dentition, turbinates, and orophanx. Nl external ear canals without cough reflex   NECK :  without JVD/Nodes/TM/ nl carotid upstrokes bilaterally   LUNGS: no acc muscle use, min dullness at L base    CV:  RRR  no s3 or murmur or increase in P2, 1+ pitting bilateral edema   ABD:  soft and nontender with nl excursion in the supine position. No bruits or organomegaly, bowel sounds nl  MS:  warm without deformities, calf tenderness, cyanosis or clubbing  SKIN: warm and dry without lesions        05/14/14 CT chest Moderate effusion on the left with primarily free-flowing but mild  loculated components is well. Posterior  left base consolidation.  Areas of parenchymal lung scarring bilaterally.  Areas of bronchiectatic change, most notably in the left lower lobe.  5 mm nodular opacity in the posterior segment right upper lobe.  Followup of this nodular opacity should be based on Fleischner  Society guidelines       Recent Labs Lab 05/25/14 1220  NA 138  K 3.7  CL 103  CO2 25  BUN 17  CREATININE 1.1  GLUCOSE 92    Recent Labs Lab 05/25/14 1220  HGB 13.7  HCT 40.5  WBC 9.7  PLT 217.0     Lab Results  Component Value Date   TSH 1.01 05/25/2014     Lab Results  Component Value Date    PROBNP 214.0* 05/25/2014     Lab Results  Component Value Date   ESRSEDRATE 45* 05/25/2014       CXR  07/08/2014 :  There is a persistent small loculated left pleural effusion layering posteriorly and laterally.    Assessment & Plan:

## 2014-07-08 NOTE — Patient Instructions (Signed)
Please remember to go to the   x-ray department downstairs for your tests - we will call you with the results when they are available.  Please schedule a follow up visit in 3 months but call sooner if needed     

## 2014-07-09 DIAGNOSIS — C44629 Squamous cell carcinoma of skin of left upper limb, including shoulder: Secondary | ICD-10-CM | POA: Diagnosis not present

## 2014-07-09 NOTE — Progress Notes (Signed)
Quick Note:  Spoke with pt and notified of results per Dr. Wert. Pt verbalized understanding and denied any questions.  ______ 

## 2014-07-11 NOTE — Assessment & Plan Note (Addendum)
Onset with probable CAP 01/03/14  See CT chest 05/14/14 ESR = 45 05/25/14     I had an extended discussion with the patient today lasting 15 to 20 minutes of a 25 minute visit on the following issues:   Discussed in detail all the  indications, usual  risks and alternatives  relative to the benefits with patient who agrees to proceed with conservative f/u based on the high likelihood this is an organized fibrotic process and nothing that could be easily drained or removed surgically but has little clinical impact.

## 2014-07-15 ENCOUNTER — Encounter: Payer: Self-pay | Admitting: Neurology

## 2014-07-16 DIAGNOSIS — I129 Hypertensive chronic kidney disease with stage 1 through stage 4 chronic kidney disease, or unspecified chronic kidney disease: Secondary | ICD-10-CM | POA: Diagnosis not present

## 2014-07-16 DIAGNOSIS — G609 Hereditary and idiopathic neuropathy, unspecified: Secondary | ICD-10-CM | POA: Diagnosis not present

## 2014-07-16 DIAGNOSIS — N183 Chronic kidney disease, stage 3 (moderate): Secondary | ICD-10-CM | POA: Diagnosis not present

## 2014-07-16 DIAGNOSIS — R269 Unspecified abnormalities of gait and mobility: Secondary | ICD-10-CM | POA: Diagnosis not present

## 2014-07-16 DIAGNOSIS — R609 Edema, unspecified: Secondary | ICD-10-CM | POA: Diagnosis not present

## 2014-07-16 DIAGNOSIS — J918 Pleural effusion in other conditions classified elsewhere: Secondary | ICD-10-CM | POA: Diagnosis not present

## 2014-07-16 DIAGNOSIS — E119 Type 2 diabetes mellitus without complications: Secondary | ICD-10-CM | POA: Diagnosis not present

## 2014-07-21 ENCOUNTER — Encounter: Payer: Self-pay | Admitting: Neurology

## 2014-07-22 DIAGNOSIS — I10 Essential (primary) hypertension: Secondary | ICD-10-CM | POA: Diagnosis not present

## 2014-07-22 DIAGNOSIS — G609 Hereditary and idiopathic neuropathy, unspecified: Secondary | ICD-10-CM | POA: Diagnosis not present

## 2014-07-29 ENCOUNTER — Other Ambulatory Visit: Payer: Self-pay | Admitting: Cardiology

## 2014-07-31 ENCOUNTER — Ambulatory Visit (INDEPENDENT_AMBULATORY_CARE_PROVIDER_SITE_OTHER): Payer: Medicare Other | Admitting: Cardiology

## 2014-07-31 ENCOUNTER — Encounter: Payer: Self-pay | Admitting: Cardiology

## 2014-07-31 VITALS — BP 147/77 | HR 75 | Ht 70.0 in | Wt 215.0 lb

## 2014-07-31 DIAGNOSIS — I493 Ventricular premature depolarization: Secondary | ICD-10-CM | POA: Diagnosis not present

## 2014-07-31 DIAGNOSIS — I10 Essential (primary) hypertension: Secondary | ICD-10-CM

## 2014-07-31 NOTE — Progress Notes (Signed)
[facility]. [address], [address], [place]  [address] Phone: [phone] Fax:  [phone]  Date:  07/31/2014   ID:  Timothy Cooper, DOB 1924-05-30, MRN [id number]  PCP:  Wynelle Fanny   History of Present Illness: Timothy Cooper is a 80 y.o. male with history of stroke, hypertension, hyperlipidemia here for followup.  Echocardiogram 12/11 with normal ejection fraction, mild MAC, mild mitral regurgitation. Nuclear stress test no evidence of ischemia, normal EF Holter monitor-10 beat irregular wide-complex tachycardia per report. No syncope. On metoprolol.   Diabetes, LDL 47, creatinine 1.2.   10/14 - moved to [place]. Almyra Free has triplets. [place]. Main complaints is neuropathy. Trying NTG left foot.   11/27/13-recent emergency department visit on 11/21/13 4 vomiting hypertension. No syncope, but felt generalized weakness, no chest pain. This occurred after dental work. Oral Psychologist, sport and exercise. In the emergency department, he was found to have PVCs. I reviewed EKG which shows ventricular quadrigeminy. Benign. Asymptomatic.  07/31/14-overall his main complaint is neuropathy. He uses 2% nitroglycerin ointment for this. Tramadol has been helping. No chest pain, no shortness of breath.   Wt Readings from Last 3 Encounters:  07/31/14 215 lb (97.523 kg)  07/08/14 216 lb (97.977 kg)  05/25/14 217 lb (98.431 kg)     Past Medical History  Diagnosis Date  . Diabetes   . 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 Marlou Porch  . Hyperlipidemia   . HTN (hypertension)   . Edema   . Neuropathy      feet, hands since 1992- seen neuro in past- idiopathic per prior work up- now sees Dr. Jannifer Franklin- failed Cymbalta  . Gait instability   . Peripheral edema   . DDD (degenerative disc disease)      of spine on CT 03/2011----moderate SS at L3-4-5, multilevel foraminal narrowing  . Glaucoma   . Macular degeneration   . Lumbar spinal stenosis   . Lumbosacral  spondylosis     Past Surgical History  Procedure Laterality Date  . Hernia repair    . Cholecystectomy    . Cataract extraction Bilateral   . Tonsillectomy      Current Outpatient Prescriptions  Medication Sig Dispense Refill  . aspirin 81 MG tablet Take 81 mg by mouth daily.    Marland Kitchen atorvastatin (LIPITOR) 10 MG tablet TAKE 1 TABLET BY MOUTH AT BEDTIME 90 tablet 0  . dorzolamide (TRUSOPT) 2 % ophthalmic solution Place 1 drop into both eyes 3 (three) times daily.     . enalapril (VASOTEC) 10 MG tablet TAKE 1 TABLET BY MOUTH EVERY DAY 90 tablet 0  . finasteride (PROSCAR) 5 MG tablet Take 5 mg by mouth daily.    . furosemide (LASIX) 20 MG tablet Take 20 mg by mouth daily.    Marland Kitchen gabapentin (NEURONTIN) 800 MG tablet Take 800 mg by mouth 3 (three) times daily.    Marland Kitchen latanoprost (XALATAN) 0.005 % ophthalmic solution Place 0.005 drops into both eyes daily.    . Multiple Vitamin (MULTIVITAMIN) capsule Take 1 capsule by mouth daily.    Marland Kitchen NITRO-BID 2 % ointment Apply 2 g topically daily.    . Omega-3 Fatty Acids (FISH OIL PO) Take 1,400 mg by mouth.    . tamsulosin (FLOMAX) 0.4 MG CAPS capsule Take 0.4 mg by mouth daily.     . traMADol (ULTRAM) 50 MG tablet Take by mouth as needed.    . Vit C-Cholecalciferol-Rose Hip (VITAMIN C &  D3/ROSE HIPS) 6126775554-20 MG-UNIT-MG CAPS Take 1 capsule by mouth daily.     No current facility-administered medications for this visit.    Allergies:   No Known Allergies  Social History:  The patient  reports that he has never smoked. He has never used smokeless tobacco. He reports that he does not drink alcohol.   ROS:  Please see the history of present illness.   No syncope, no bleeding, no new strokelike symptoms, no chest pain    PHYSICAL EXAM: VS:  BP 147/77 mmHg  Pulse 75  Ht 5\' 10"  (1.914 m)  Wt 215 lb (97.523 kg)  BMI 30.85 kg/m2 Well nourished, well developed, in no acute distress HEENT: normal Neck: no JVD Cardiac:  normal S1, S2; RRR; no  murmurFrequent ectopy noted Lungs:  clear to auscultation bilaterally, no wheezing, rhonchi or rales Abd: soft, nontender, no hepatomegaly Ext: no edema Skin: warm and dry Neuro: no focal abnormalities noted  EKG:  07/31/14-sinus rhythm with sinus arrhythmia, occasional PVC, right bundle branch block, heart rate 73. Previous-Sinus rhythm, 88 with no other changes     ECHO 2011: 1. Normal LV size and function.  2. There were no regional wall motion abnormalities.  3. Left ventricular ejection fraction estimated by 2D at 60-65 percent.  4. Mild left atrial enlargement.  5. Mild mitral annular calcification.  6. Mild mitral valve regurgitation.  7. Trace aortic valve regurgitation.  8. Analysis of mitral valve inflow, pulmonary vein Doppler and tissue Doppler suggests grade I diastolic dysfunction without elevated left atrial pressure.  ASSESSMENT AND PLAN:  1. Hypertension -medications reviewed. Doing very well. No changes made today. Continue with low-sodium diet. 2. Prior stroke 3. Gait instability-cane. 4. PVC's---Multiple seen on EKG. On exam today I do hear an occasional PVC. Continue with metoprolol. No changes made. 5. Hyperlipidemia-secondary prevention.  6. Six-month follow-up-no change  Signed, Candee Furbish, MD [facility]  07/31/2014 3:07 PM

## 2014-07-31 NOTE — Patient Instructions (Signed)
The current medical regimen is effective;  continue present plan and medications.  Follow up in 6 months with Dr. Skains.  You will receive a letter in the mail 2 months before you are due.  Please call us when you receive this letter to schedule your follow up appointment.  

## 2014-09-10 DIAGNOSIS — L603 Nail dystrophy: Secondary | ICD-10-CM | POA: Diagnosis not present

## 2014-09-10 DIAGNOSIS — I739 Peripheral vascular disease, unspecified: Secondary | ICD-10-CM | POA: Diagnosis not present

## 2014-09-10 DIAGNOSIS — E1151 Type 2 diabetes mellitus with diabetic peripheral angiopathy without gangrene: Secondary | ICD-10-CM | POA: Diagnosis not present

## 2014-09-30 ENCOUNTER — Other Ambulatory Visit: Payer: Self-pay

## 2014-09-30 MED ORDER — ENALAPRIL MALEATE 10 MG PO TABS: 10.0000 mg | ORAL_TABLET | Freq: Every day | ORAL | Status: DC

## 2014-09-30 MED ORDER — ATORVASTATIN CALCIUM 10 MG PO TABS: 10.0000 mg | ORAL_TABLET | Freq: Every day | ORAL | Status: DC

## 2014-10-07 ENCOUNTER — Ambulatory Visit: Payer: Medicare Other | Admitting: Internal Medicine

## 2014-10-12 ENCOUNTER — Ambulatory Visit: Payer: Medicare Other | Admitting: Internal Medicine

## 2014-10-13 ENCOUNTER — Ambulatory Visit (INDEPENDENT_AMBULATORY_CARE_PROVIDER_SITE_OTHER): Payer: Medicare Other | Admitting: Internal Medicine

## 2014-10-13 ENCOUNTER — Encounter (INDEPENDENT_AMBULATORY_CARE_PROVIDER_SITE_OTHER): Payer: Self-pay

## 2014-10-13 ENCOUNTER — Ambulatory Visit (INDEPENDENT_AMBULATORY_CARE_PROVIDER_SITE_OTHER)
Admission: RE | Admit: 2014-10-13 | Discharge: 2014-10-13 | Disposition: A | Discharge status: Home / Self Care | Payer: Medicare Other | Source: Ambulatory Visit | Attending: Internal Medicine | Admitting: Internal Medicine

## 2014-10-13 ENCOUNTER — Other Ambulatory Visit (INDEPENDENT_AMBULATORY_CARE_PROVIDER_SITE_OTHER): Payer: Medicare Other

## 2014-10-13 ENCOUNTER — Encounter: Payer: Self-pay | Admitting: Internal Medicine

## 2014-10-13 DIAGNOSIS — J90 Pleural effusion, not elsewhere classified: Secondary | ICD-10-CM

## 2014-10-13 LAB — SEDIMENTATION RATE: Sed Rate: 51 mm/hr — ABNORMAL HIGH (ref 0–22)

## 2014-10-13 NOTE — Progress Notes (Signed)
Subjective:    Patient ID: Timothy Cooper, male    DOB: [birth year]  MRN: [id number]    Brief patient profile:   36 yowm never smoker acutely ill with cough/ runny eyes / low grade fever with patchy LLL as dz/ effusion on cxr 01/03/14 with persistent L effusion / partially loculated by CT 05/14/14 so referred to pulmonary clinic 05/25/2014 by Timothy Levering PA [facility]   History of Present Illness  05/25/2014 1st [facility] office visit/ Timothy Cooper   Chief Complaint  Patient presents with  . Pulmonary Consult    Referred by Dr. Carlos Cooper for eval of abn CT Chest. Pt c/o DOE when gets in a hurry, but not limited by SOB and able to do what he wants. He had PNA approx 3 months ago.   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 After spring/summer of 2015 CAP?  Chief Complaint  Patient presents with  . Follow-up    Pt states his breathing is doing well. No new co's today.   Not limited by breathing from desired activities      No obvious day to day or daytime variabilty or assoc chronic cough or cp or chest tightness, subjective wheeze overt sinus or hb symptoms. No unusual exp hx or h/o childhood pna/ asthma or knowledge of premature birth.  Sleeping ok without nocturnal  or early am exacerbation  of respiratory  c/o's or need for noct saba. Also denies any obvious fluctuation of symptoms with weather or environmental changes or other aggravating or alleviating factors except as outlined above   Current Medications, Allergies, Complete Past Medical History, Past Surgical History, Family History, and Social History were reviewed in [employer] record.  ROS  The following are not active complaints unless bolded sore throat, dysphagia, dental problems, itching, sneezing,  nasal congestion or excess/  purulent secretions, ear ache,   fever, chills, sweats, unintended wt loss, pleuritic or exertional cp, hemoptysis,  orthopnea pnd or leg swelling, presyncope, palpitations, heartburn, abdominal pain, anorexia, nausea, vomiting, diarrhea  or change in bowel or urinary habits, change in stools or urine, dysuria,hematuria,  rash, arthralgias, visual complaints, headache, numbness weakness or ataxia or problems with walking or coordination,  change in mood/affect or memory.          Objective:   Physical Exam  07/08/2014      216 > 10/13/2014  215  Wt Readings from Last 3 Encounters:  05/25/14 217 lb (98.431 kg)  01/03/14 218 lb (98.884 kg)  11/27/13 222 lb (100.699 kg)      amb wm nad   HEENT: nl dentition, turbinates, and orophanx. Nl external ear canals without cough reflex   NECK :  without JVD/Nodes/TM/ nl carotid upstrokes bilaterally   LUNGS: no acc muscle use, min dullness at L base    CV:  RRR  no s3 or murmur or increase in P2, trace pitting bilateral edema despite elastic hose   ABD:  soft and nontender with nl excursion in the supine position. No bruits or organomegaly, bowel sounds nl  MS:  warm without deformities, calf tenderness, cyanosis or clubbing  SKIN: warm and dry without lesions                  Labs ordered today / images reviewed include:  Lab Results  Component Value Date   ESRSEDRATE 51* 10/13/2014   ESRSEDRATE 45* 05/25/2014    CXR 10/13/14 Unchanged small left pleural effusion with underlying consolidative opacities.     Previous relevant labs:    Lab Results  Component Value Date   TSH 1.01 05/25/2014     Lab Results  Component Value Date   PROBNP 214.0* 05/25/2014           Assessment & Plan:

## 2014-10-13 NOTE — Patient Instructions (Addendum)
Please remember to go to the lab and x-ray department downstairs for your tests - we will call you with the results when they are available.  Late add:  F/u in 3 m

## 2014-10-14 ENCOUNTER — Ambulatory Visit: Payer: Medicare Other | Admitting: Neurology

## 2014-10-14 NOTE — Progress Notes (Signed)
Quick Note:  Spoke with pt and notified of results per Dr. Wert. Pt verbalized understanding and denied any questions.  ______ 

## 2014-10-14 NOTE — Progress Notes (Signed)
Quick Note:  LMTCB ______ 

## 2014-10-14 NOTE — Progress Notes (Signed)
Quick Note:  Error- have not spoke with the pt yet  LMTCB ______

## 2014-10-15 ENCOUNTER — Telehealth: Payer: Self-pay | Admitting: Internal Medicine

## 2014-10-15 NOTE — Telephone Encounter (Signed)
Spoke with pt and advised of cxr and lab results per Dr Melvyn Novas

## 2014-11-03 DIAGNOSIS — E119 Type 2 diabetes mellitus without complications: Secondary | ICD-10-CM | POA: Diagnosis not present

## 2014-11-03 DIAGNOSIS — I129 Hypertensive chronic kidney disease with stage 1 through stage 4 chronic kidney disease, or unspecified chronic kidney disease: Secondary | ICD-10-CM | POA: Diagnosis not present

## 2014-11-03 DIAGNOSIS — N183 Chronic kidney disease, stage 3 (moderate): Secondary | ICD-10-CM | POA: Diagnosis not present

## 2014-11-03 DIAGNOSIS — R609 Edema, unspecified: Secondary | ICD-10-CM | POA: Diagnosis not present

## 2014-11-03 DIAGNOSIS — G609 Hereditary and idiopathic neuropathy, unspecified: Secondary | ICD-10-CM | POA: Diagnosis not present

## 2014-11-18 DIAGNOSIS — R350 Frequency of micturition: Secondary | ICD-10-CM | POA: Diagnosis not present

## 2014-11-18 DIAGNOSIS — N403 Nodular prostate with lower urinary tract symptoms: Secondary | ICD-10-CM | POA: Diagnosis not present

## 2014-11-18 DIAGNOSIS — N3020 Other chronic cystitis without hematuria: Secondary | ICD-10-CM | POA: Diagnosis not present

## 2014-11-23 DIAGNOSIS — L603 Nail dystrophy: Secondary | ICD-10-CM | POA: Diagnosis not present

## 2014-11-23 DIAGNOSIS — E1151 Type 2 diabetes mellitus with diabetic peripheral angiopathy without gangrene: Secondary | ICD-10-CM | POA: Diagnosis not present

## 2014-11-23 DIAGNOSIS — I739 Peripheral vascular disease, unspecified: Secondary | ICD-10-CM | POA: Diagnosis not present

## 2015-01-19 ENCOUNTER — Encounter: Payer: Self-pay | Admitting: Internal Medicine

## 2015-01-19 ENCOUNTER — Ambulatory Visit (INDEPENDENT_AMBULATORY_CARE_PROVIDER_SITE_OTHER): Payer: Medicare Other | Admitting: Internal Medicine

## 2015-01-19 ENCOUNTER — Ambulatory Visit (INDEPENDENT_AMBULATORY_CARE_PROVIDER_SITE_OTHER)
Admission: RE | Admit: 2015-01-19 | Discharge: 2015-01-19 | Disposition: A | Discharge status: Home / Self Care | Payer: Medicare Other | Source: Ambulatory Visit | Attending: Internal Medicine | Admitting: Internal Medicine

## 2015-01-19 ENCOUNTER — Encounter (INDEPENDENT_AMBULATORY_CARE_PROVIDER_SITE_OTHER): Payer: Self-pay

## 2015-01-19 VITALS — BP 140/80 | HR 78 | Ht 70.5 in | Wt 204.2 lb

## 2015-01-19 DIAGNOSIS — J90 Pleural effusion, not elsewhere classified: Secondary | ICD-10-CM | POA: Diagnosis not present

## 2015-01-19 NOTE — Progress Notes (Signed)
Subjective:    Patient ID: Timothy Cooper, male    DOB: August 25, 1924  MRN: [id number]    Brief patient profile:   79 yowm never smoker acutely ill with cough/ runny eyes / low grade fever with patchy LLL as dz/ effusion on cxr 01/03/14 with persistent L effusion / partially loculated by CT 05/14/14 so referred to pulmonary clinic 05/25/2014 by Carlos Levering [facility]   History of Present Illness  05/25/2014 1st [facility] office visit/ Seith Aikey   Chief Complaint  Patient presents with  . Pulmonary Consult    Referred by Dr. Carlos Levering for eval of abn CT Chest. Pt c/o DOE when gets in a hurry, but not limited by SOB and able to do what he wants. He had PNA approx 3 months ago.   minimal chronic cough / throat clearing s mucus production on vasotec rec Conservative rx for what is likely very late phase parapneumonic effusion    01/19/2015 f/u ov/Curlee Bogan re: f/u probable parapneumonic effusion/ pleural scarring p cap May 2015 Chief Complaint  Patient presents with  . Follow-up    Pt states that his breathing is doing well and denies any new co's today.      Not limited by breathing from desired activities  But by neuropathy  No obvious day to day or daytime variabilty or assoc chronic cough or cp or chest tightness, subjective wheeze overt sinus or hb symptoms. No unusual exp hx or h/o childhood pna/ asthma or knowledge of premature birth.  Sleeping ok without nocturnal  or early am exacerbation  of respiratory  c/o's or need for noct saba. Also denies any obvious fluctuation of symptoms with weather or environmental changes or other aggravating or alleviating factors except as outlined above   Current Medications, Allergies, Complete Past Medical History, Past Surgical History, Family History, and Social History were reviewed in [employer] record.  ROS  The following are not active complaints unless bolded sore throat, dysphagia, dental  problems, itching, sneezing,  nasal congestion or excess/ purulent secretions, ear ache,   fever, chills, sweats, unintended wt loss, pleuritic or exertional cp, hemoptysis,  orthopnea pnd or leg swelling, presyncope, palpitations, heartburn, abdominal pain, anorexia, nausea, vomiting, diarrhea  or change in bowel or urinary habits, change in stools or urine, dysuria,hematuria,  rash, arthralgias, visual complaints, headache, numbness weakness or ataxia or problems with walking or coordination,  change in mood/affect or memory.          Objective:   Physical Exam  07/08/2014      216 > 10/13/2014  215 > 01/19/2015  204  Wt Readings from Last 3 Encounters:  05/25/14 217 lb (98.431 kg)  01/03/14 218 lb (98.884 kg)  11/27/13 222 lb (100.699 kg)      amb wm nad   HEENT: nl dentition, turbinates, and orophanx. Nl external ear canals without cough reflex   NECK :  without JVD/Nodes/TM/ nl carotid upstrokes bilaterally   LUNGS: no acc muscle use, min dullness at L base    CV:  RRR  no s3 or murmur or increase in P2,  1+ pitting bilateral edema    ABD:  soft and nontender with nl excursion in the supine position. No bruits or organomegaly, bowel sounds nl  MS:  warm without deformities, calf tenderness, cyanosis or clubbing  SKIN: warm and dry without lesions                CXR PA and Lateral:  01/19/2015 :     I personally reviewed images and agree with radiology impression as follows:     Stable loculated small to moderate left pleural effusion.                   Assessment & Plan:

## 2015-01-19 NOTE — Progress Notes (Signed)
Quick Note:  Spoke with pt and notified of results per Dr. Wert. Pt verbalized understanding and denied any questions.  ______ 

## 2015-01-19 NOTE — Assessment & Plan Note (Signed)
Onset with probable CAP 01/03/14  See CT chest 05/14/14 ESR = 45 05/25/14 vs 51 10/13/2014  CXR 07/09/14 no change >  No change 10/13/2014    I had an extended discussion with the patient reviewing all relevant studies completed to date and  lasting 15 to 20 minutes of a 25 minute visit on the following ongoing concerns:  1) no clinical indication that he needs more aggressive rx but the process has not resolved radiolraphically and the elevated esr persists  2) Discussed in detail all the  indications, usual  risks and alternatives  relative to the benefits with patient and fm who agree  to proceed with conservative f/u

## 2015-01-19 NOTE — Patient Instructions (Signed)
Please remember to go to the  x-ray department downstairs for your tests - we will call you with the results when they are available.     

## 2015-01-20 ENCOUNTER — Encounter: Payer: Self-pay | Admitting: Internal Medicine

## 2015-01-20 NOTE — Assessment & Plan Note (Signed)
Onset with probable CAP 01/03/14  See CT chest 05/14/14 ESR = 45 05/25/14 vs 51 10/13/2014  CXR 07/09/14 no change >  No change 10/13/2014 and 01/19/15 > no further f/u needed   I had an extended final summary discussion with the patient reviewing all relevant studies completed to date and  lasting 15 to 20 minutes of a 25 minute visit on the following issues:     1) all the evidence points to a benign parapneumonic process with scarrring in L pleural space asymptomatic now as so sedentary related to neuropathy  2) due to poor expansion of LLL there is some risk of recurrent CAP but other than keep up with vaccinations for flu (he has already had prevnar 13) there is nothing else I recommend other than staying as active as possible  3) Each maintenance medication was reviewed in detail including most importantly the difference between maintenance and as needed and under what circumstances the prns are to be used.  Please see instructions for details which were reviewed in writing and the patient given a copy.   4) pulmonary f/u is prn

## 2015-02-01 DIAGNOSIS — L603 Nail dystrophy: Secondary | ICD-10-CM | POA: Diagnosis not present

## 2015-02-01 DIAGNOSIS — L97529 Non-pressure chronic ulcer of other part of left foot with unspecified severity: Secondary | ICD-10-CM | POA: Diagnosis not present

## 2015-02-01 DIAGNOSIS — I739 Peripheral vascular disease, unspecified: Secondary | ICD-10-CM | POA: Diagnosis not present

## 2015-02-01 DIAGNOSIS — E1151 Type 2 diabetes mellitus with diabetic peripheral angiopathy without gangrene: Secondary | ICD-10-CM | POA: Diagnosis not present

## 2015-02-08 DIAGNOSIS — L97529 Non-pressure chronic ulcer of other part of left foot with unspecified severity: Secondary | ICD-10-CM | POA: Diagnosis not present

## 2015-02-22 DIAGNOSIS — L97529 Non-pressure chronic ulcer of other part of left foot with unspecified severity: Secondary | ICD-10-CM | POA: Diagnosis not present

## 2015-02-22 DIAGNOSIS — T8189XA Other complications of procedures, not elsewhere classified, initial encounter: Secondary | ICD-10-CM | POA: Diagnosis not present

## 2015-03-02 DIAGNOSIS — R269 Unspecified abnormalities of gait and mobility: Secondary | ICD-10-CM | POA: Diagnosis not present

## 2015-03-02 DIAGNOSIS — R609 Edema, unspecified: Secondary | ICD-10-CM | POA: Diagnosis not present

## 2015-03-02 DIAGNOSIS — E785 Hyperlipidemia, unspecified: Secondary | ICD-10-CM | POA: Diagnosis not present

## 2015-03-02 DIAGNOSIS — J918 Pleural effusion in other conditions classified elsewhere: Secondary | ICD-10-CM | POA: Diagnosis not present

## 2015-03-02 DIAGNOSIS — E119 Type 2 diabetes mellitus without complications: Secondary | ICD-10-CM | POA: Diagnosis not present

## 2015-03-02 DIAGNOSIS — I129 Hypertensive chronic kidney disease with stage 1 through stage 4 chronic kidney disease, or unspecified chronic kidney disease: Secondary | ICD-10-CM | POA: Diagnosis not present

## 2015-03-02 DIAGNOSIS — N183 Chronic kidney disease, stage 3 (moderate): Secondary | ICD-10-CM | POA: Diagnosis not present

## 2015-03-02 DIAGNOSIS — G609 Hereditary and idiopathic neuropathy, unspecified: Secondary | ICD-10-CM | POA: Diagnosis not present

## 2015-03-19 DIAGNOSIS — H4011X0 Primary open-angle glaucoma, stage unspecified: Secondary | ICD-10-CM | POA: Diagnosis not present

## 2015-03-19 DIAGNOSIS — H18411 Arcus senilis, right eye: Secondary | ICD-10-CM | POA: Diagnosis not present

## 2015-03-19 DIAGNOSIS — H18412 Arcus senilis, left eye: Secondary | ICD-10-CM | POA: Diagnosis not present

## 2015-03-19 DIAGNOSIS — Z961 Presence of intraocular lens: Secondary | ICD-10-CM | POA: Diagnosis not present

## 2015-03-22 DIAGNOSIS — M7752 Other enthesopathy of left foot: Secondary | ICD-10-CM | POA: Diagnosis not present

## 2015-04-26 DIAGNOSIS — I739 Peripheral vascular disease, unspecified: Secondary | ICD-10-CM | POA: Diagnosis not present

## 2015-04-26 DIAGNOSIS — E1151 Type 2 diabetes mellitus with diabetic peripheral angiopathy without gangrene: Secondary | ICD-10-CM | POA: Diagnosis not present

## 2015-04-26 DIAGNOSIS — L603 Nail dystrophy: Secondary | ICD-10-CM | POA: Diagnosis not present

## 2015-04-27 ENCOUNTER — Other Ambulatory Visit: Payer: Self-pay | Admitting: Cardiology

## 2015-06-18 DIAGNOSIS — N400 Enlarged prostate without lower urinary tract symptoms: Secondary | ICD-10-CM | POA: Diagnosis not present

## 2015-06-18 DIAGNOSIS — G609 Hereditary and idiopathic neuropathy, unspecified: Secondary | ICD-10-CM | POA: Diagnosis not present

## 2015-06-18 DIAGNOSIS — I10 Essential (primary) hypertension: Secondary | ICD-10-CM | POA: Diagnosis not present

## 2015-06-18 DIAGNOSIS — N183 Chronic kidney disease, stage 3 (moderate): Secondary | ICD-10-CM | POA: Diagnosis not present

## 2015-06-18 DIAGNOSIS — E119 Type 2 diabetes mellitus without complications: Secondary | ICD-10-CM | POA: Diagnosis not present

## 2015-06-18 DIAGNOSIS — Z23 Encounter for immunization: Secondary | ICD-10-CM | POA: Diagnosis not present

## 2015-06-18 DIAGNOSIS — I129 Hypertensive chronic kidney disease with stage 1 through stage 4 chronic kidney disease, or unspecified chronic kidney disease: Secondary | ICD-10-CM | POA: Diagnosis not present

## 2015-06-18 DIAGNOSIS — E785 Hyperlipidemia, unspecified: Secondary | ICD-10-CM | POA: Diagnosis not present

## 2015-06-22 ENCOUNTER — Other Ambulatory Visit: Payer: Self-pay | Admitting: Cardiology

## 2015-06-30 DIAGNOSIS — D649 Anemia, unspecified: Secondary | ICD-10-CM | POA: Diagnosis not present

## 2015-06-30 DIAGNOSIS — E119 Type 2 diabetes mellitus without complications: Secondary | ICD-10-CM | POA: Diagnosis not present

## 2015-06-30 DIAGNOSIS — E785 Hyperlipidemia, unspecified: Secondary | ICD-10-CM | POA: Diagnosis not present

## 2015-07-12 DIAGNOSIS — H04121 Dry eye syndrome of right lacrimal gland: Secondary | ICD-10-CM | POA: Diagnosis not present

## 2015-07-12 DIAGNOSIS — Z961 Presence of intraocular lens: Secondary | ICD-10-CM | POA: Diagnosis not present

## 2015-07-12 DIAGNOSIS — H409 Unspecified glaucoma: Secondary | ICD-10-CM | POA: Diagnosis not present

## 2015-07-12 DIAGNOSIS — H353130 Nonexudative age-related macular degeneration, bilateral, stage unspecified: Secondary | ICD-10-CM | POA: Diagnosis not present

## 2015-07-13 DIAGNOSIS — I739 Peripheral vascular disease, unspecified: Secondary | ICD-10-CM | POA: Diagnosis not present

## 2015-07-13 DIAGNOSIS — L603 Nail dystrophy: Secondary | ICD-10-CM | POA: Diagnosis not present

## 2015-07-13 DIAGNOSIS — M792 Neuralgia and neuritis, unspecified: Secondary | ICD-10-CM | POA: Diagnosis not present

## 2015-07-15 DIAGNOSIS — Z85828 Personal history of other malignant neoplasm of skin: Secondary | ICD-10-CM | POA: Diagnosis not present

## 2015-07-15 DIAGNOSIS — L821 Other seborrheic keratosis: Secondary | ICD-10-CM | POA: Diagnosis not present

## 2015-07-15 DIAGNOSIS — D1801 Hemangioma of skin and subcutaneous tissue: Secondary | ICD-10-CM | POA: Diagnosis not present

## 2015-07-20 DIAGNOSIS — H3561 Retinal hemorrhage, right eye: Secondary | ICD-10-CM | POA: Diagnosis not present

## 2015-07-20 DIAGNOSIS — Z9849 Cataract extraction status, unspecified eye: Secondary | ICD-10-CM | POA: Diagnosis not present

## 2015-07-20 DIAGNOSIS — H5203 Hypermetropia, bilateral: Secondary | ICD-10-CM | POA: Diagnosis not present

## 2015-07-20 DIAGNOSIS — H52223 Regular astigmatism, bilateral: Secondary | ICD-10-CM | POA: Diagnosis not present

## 2015-07-20 DIAGNOSIS — H524 Presbyopia: Secondary | ICD-10-CM | POA: Diagnosis not present

## 2015-07-20 DIAGNOSIS — Z961 Presence of intraocular lens: Secondary | ICD-10-CM | POA: Diagnosis not present

## 2015-07-20 DIAGNOSIS — H353132 Nonexudative age-related macular degeneration, bilateral, intermediate dry stage: Secondary | ICD-10-CM | POA: Diagnosis not present

## 2015-08-01 IMAGING — CT CT CHEST W/ CM
2 of 3 series · 15 of 36 positions shown, 18 images · IV contrast (omnipaque)
Comparison: Chest radiograph May 01, 2014

CLINICAL DATA: Left pleural effusion with infiltrate

EXAM:
CT CHEST WITH CONTRAST
TECHNIQUE: Multidetector CT imaging of the chest was performed during
intravenous contrast administration.
CONTRAST:  75mL OMNIPAQUE IOHEXOL 300 MG/ML  SOLN

[Series 2: chest with · axial · 0.70mm/px · z∈[-306,-22]mm · 12 of 69 slices shown, 15 images]
[im 6/69  mediastinal]
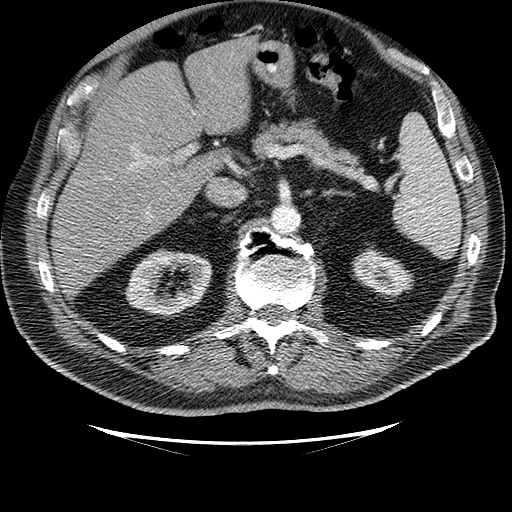
[im 6/69  lung]
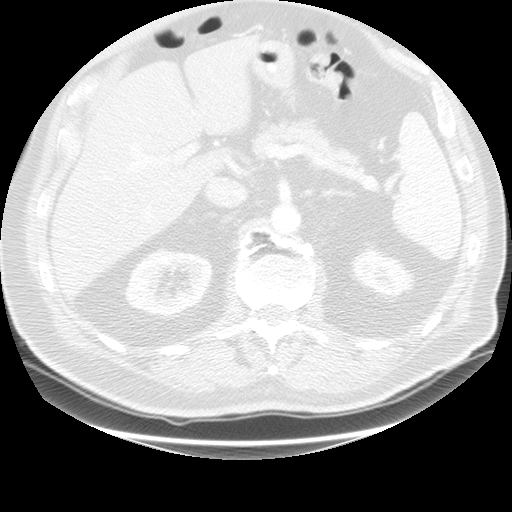
[im 11/69  lung]
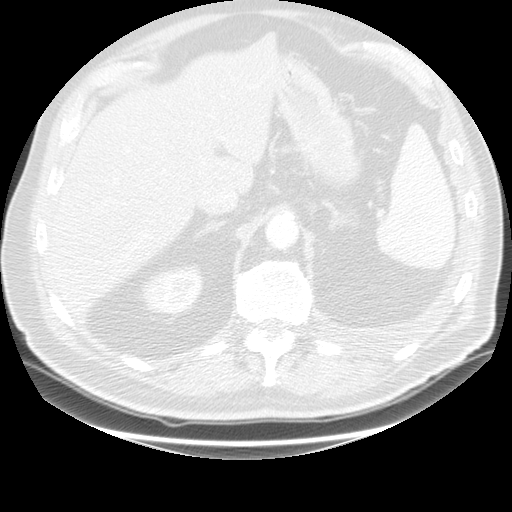
[im 16/69  lung]
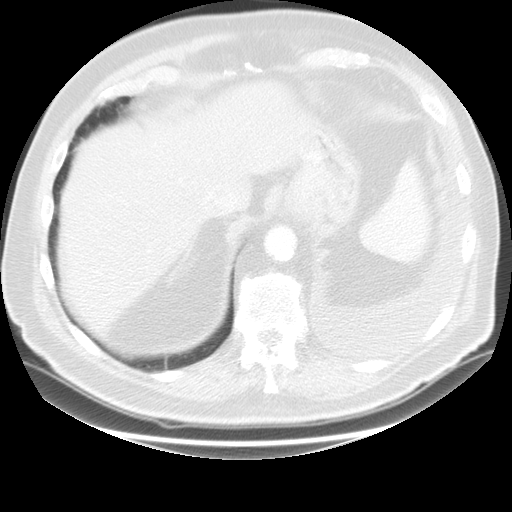
[im 21/69  lung]
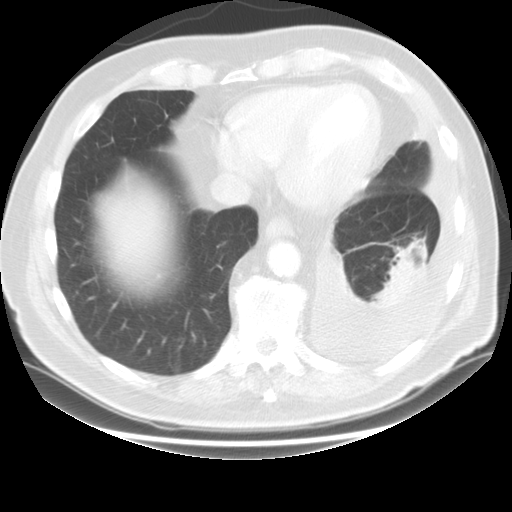
[im 26/69  mediastinal]
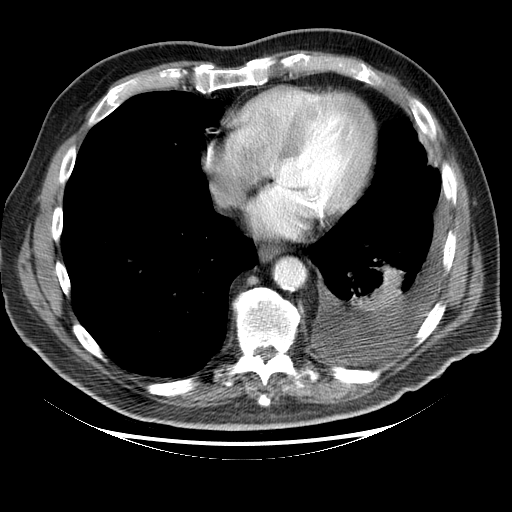
[im 26/69  lung]
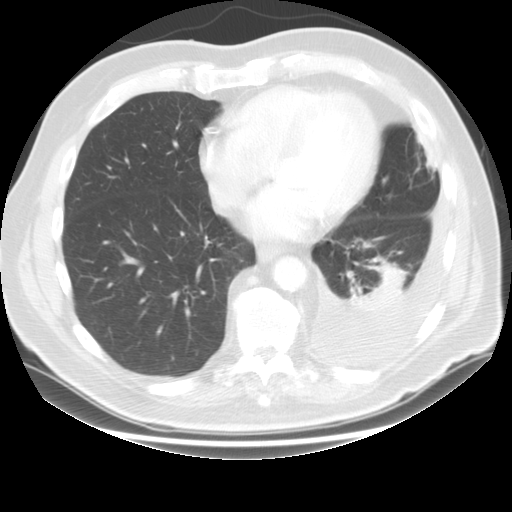
[im 31/69  lung]
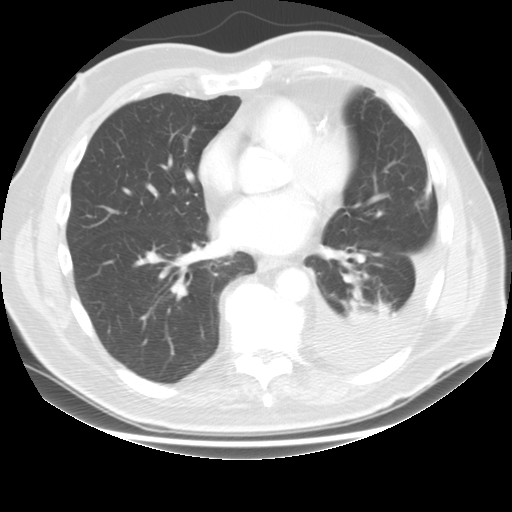
[im 38/69  lung]
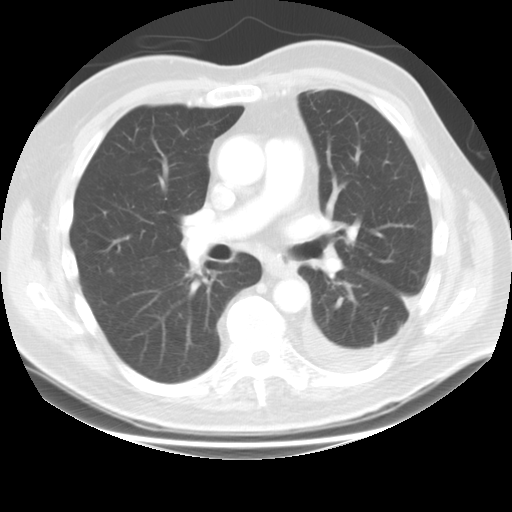
[im 43/69  lung]
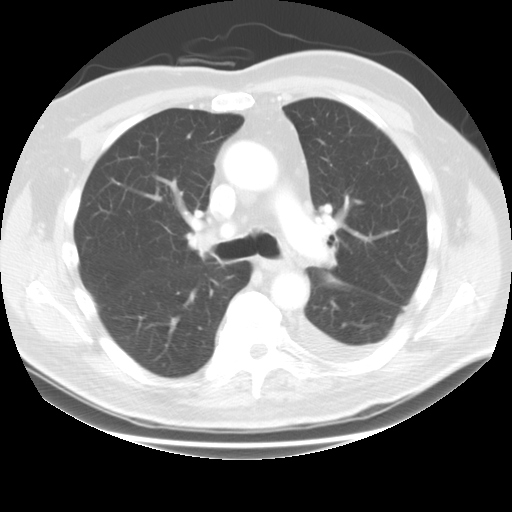
[im 48/69  mediastinal]
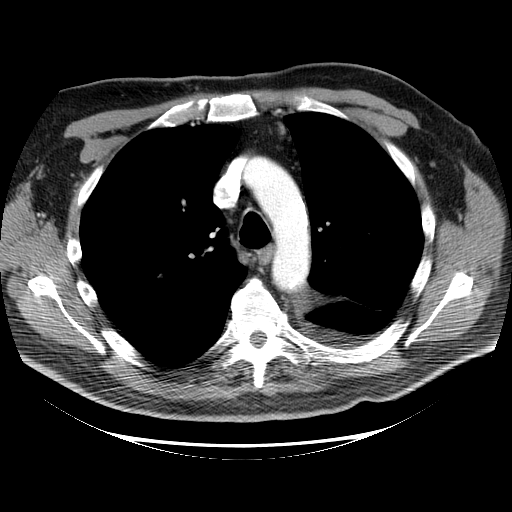
[im 48/69  lung]
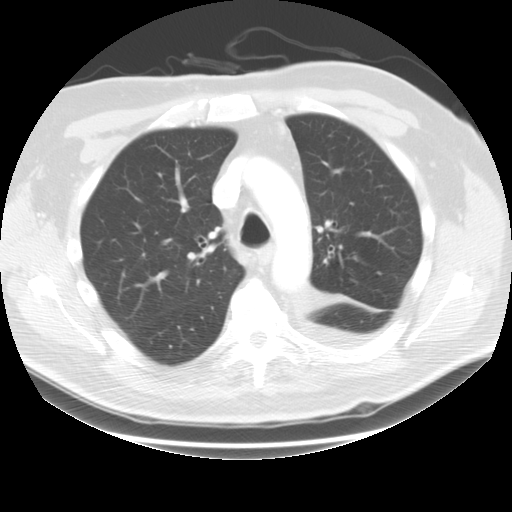
[im 53/69  lung]
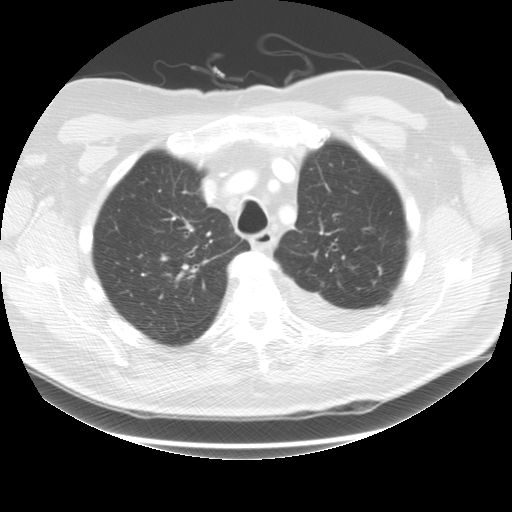
[im 58/69  lung]
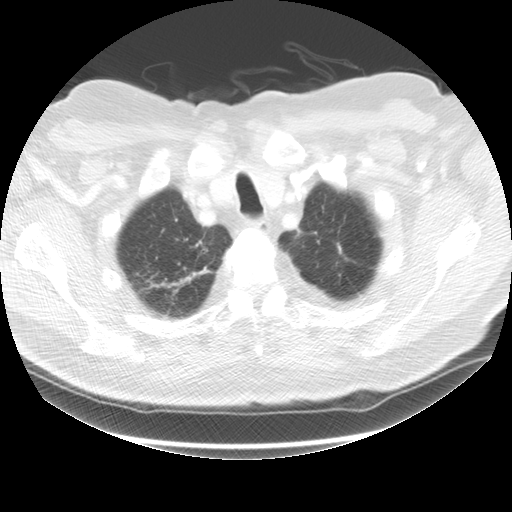
[im 63/69  lung]
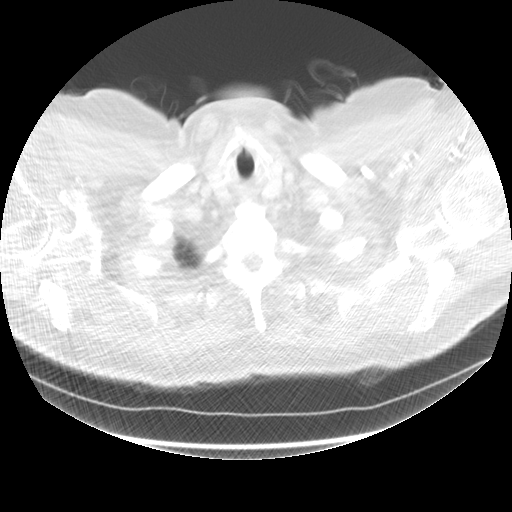

[Series 400: cor · coronal · 0.70mm/px · 3 of 145 slices shown]
[im 29/145  lung]
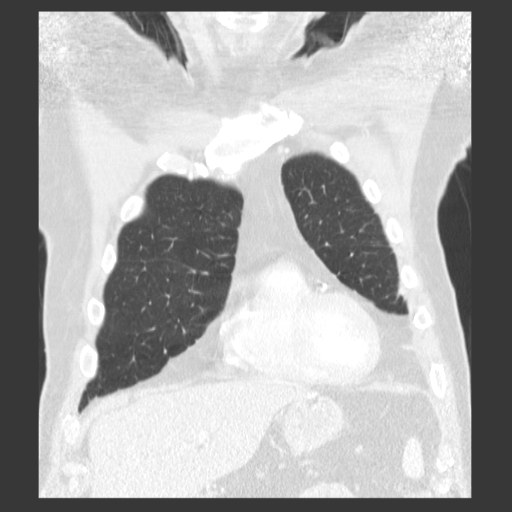
[im 58/145  lung]
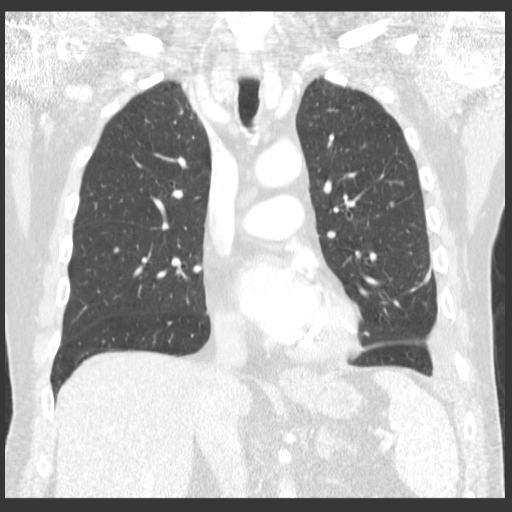
[im 87/145  lung]
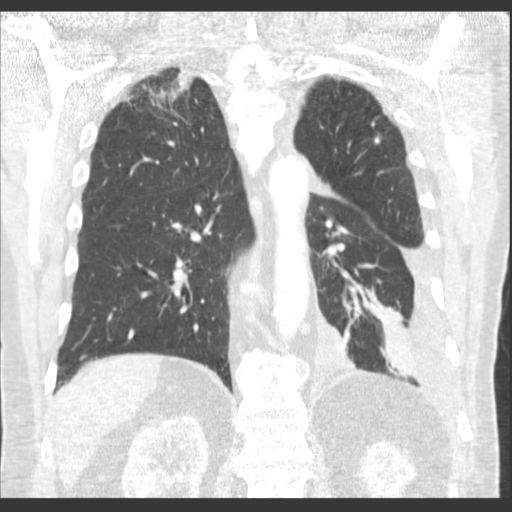

[15 of 36 positions shown; findings below may reference images not displayed]

FINDINGS: There is a moderate left pleural effusion with both free-flowing and
to a lesser extent loculated components. There is patchy
consolidation in the left lower lobe posteriorly. There is scarring
in the inferior lingula on the left. There is also scarring in both
apices, slightly more on the right than on the left.

On axial slice 13 series 3, there is a 5 mm nodular opacity in the
posterior segment of the right upper lobe. There is a calcified
granuloma in the posterior segment of the left upper lobe. There is
bronchiectatic change in the left lower lobe and to a much lesser
degree in the right lower lobe. There is slight upper lobe
bronchiectatic change bilaterally as well, symmetric.

There is no appreciable thoracic adenopathy. There are multiple foci
of coronary artery calcification. The pericardium is not thickened.

There is a small hiatal hernia.

There is atherosclerotic change in the aorta but no aneurysm or
dissection. There is no demonstrable pulmonary embolus.

In the visualized upper abdomen there is fatty change in the liver.
There is incomplete visualization of a cyst in the left kidney
midportion measuring 1.5 x 1.6 cm. Adrenals bilaterally appear
normal. There is degenerative change in the thoracic spine. There
are no blastic or lytic bone lesions. Thyroid appears unremarkable.
IMPRESSION: Moderate effusion on the left with primarily free-flowing but mild
loculated components is well. Posterior left base consolidation.
Areas of parenchymal lung scarring bilaterally.

Areas of bronchiectatic change, most notably in the left lower lobe.

5 mm nodular opacity in the posterior segment right upper lobe.
Followup of this nodular opacity should be based on [employer]
Society guidelines. If the patient is at high risk for bronchogenic
carcinoma, follow-up chest CT at 6-12 months is recommended. If the
patient is at low risk for bronchogenic carcinoma, follow-up chest
CT at 12 months is recommended. This recommendation follows the
consensus statement: Guidelines for Management of Small Pulmonary
Nodules Detected on CT Scans: A Statement from the [employer]

No appreciable adenopathy. Multiple foci of coronary artery
calcification present. Fatty liver present. Small hiatal hernia
present.

## 2015-08-07 ENCOUNTER — Other Ambulatory Visit: Payer: Self-pay | Admitting: Cardiology

## 2015-08-31 ENCOUNTER — Telehealth: Payer: Self-pay | Admitting: Cardiology

## 2015-08-31 NOTE — Telephone Encounter (Signed)
New Message  Pt calling to see if he can also be seen w/ his wife on 1/5 @ 11am w/ Dr Marlou Porch- wanted to do it at the same time. (no open slot but there's an end slot @ 11:15) Please call back and discuss.

## 2015-08-31 NOTE — Telephone Encounter (Signed)
Informed patient that he has been added to the schedule that day at 11:15 a.m. Patient thanks me for helping with this.   (received phone call from patient's dtr about if mom and dad are able to travel to [place] later this summer for family wedding. They will be in elevations around [phone] feet.  Reviewed with Skain's who will see patient for follow up and to determine if this will be ok)  Will route to [facility] nurse to follow up on and inform dtr of Skain's recommendations when patient is seen in office later this week.

## 2015-09-02 ENCOUNTER — Encounter: Payer: Self-pay | Admitting: Cardiology

## 2015-09-02 ENCOUNTER — Ambulatory Visit (INDEPENDENT_AMBULATORY_CARE_PROVIDER_SITE_OTHER): Payer: Medicare Other | Admitting: Cardiology

## 2015-09-02 VITALS — BP 150/74 | HR 75 | Ht 70.0 in | Wt 198.0 lb

## 2015-09-02 DIAGNOSIS — I493 Ventricular premature depolarization: Secondary | ICD-10-CM

## 2015-09-02 DIAGNOSIS — E785 Hyperlipidemia, unspecified: Secondary | ICD-10-CM

## 2015-09-02 DIAGNOSIS — Z8673 Personal history of transient ischemic attack (TIA), and cerebral infarction without residual deficits: Secondary | ICD-10-CM

## 2015-09-02 DIAGNOSIS — I10 Essential (primary) hypertension: Secondary | ICD-10-CM

## 2015-09-02 NOTE — Telephone Encounter (Signed)
Pt here today for an appt and this will be discussed during the visit with Dr Marlou Porch.

## 2015-09-02 NOTE — Patient Instructions (Signed)

## 2015-09-02 NOTE — Progress Notes (Signed)
[facility] [address], [address] [place],   [address] Phone: [phone] Fax:  [phone]  Date:  09/02/2015   ID:  Cyndia Skeeters, DOB 08/11/1924, MRN [id number]  PCP:  Wynelle Fanny   History of Present Illness: Timothy Cooper is a 82 y.o. male with history of stroke, hypertension, hyperlipidemia here for followup.    Diabetes, LDL 47, creatinine 1.2.   10/14 - moved to [place]. Almyra Free has triplets. [place]. Main complaints is neuropathy. Trying NTG left foot.   11/27/13-emergency department visit on 11/21/13 for vomiting hypertension. No syncope, but felt generalized weakness, no chest pain. This occurred after dental work. Oral Psychologist, sport and exercise. In the emergency department, he was found to have PVCs. I reviewed EKG which shows ventricular quadrigeminy. Benign. Asymptomatic.  07/31/14-overall his main complaint is neuropathy. He uses 2% nitroglycerin ointment for this. Tramadol has been helping. No chest pain, no shortness of breath.  09/02/15 - inquiring about traveling to wedding in [place]. He overall feels well, no chest pain, no syncope, no dizziness. He uses a walker for stability. No shortness of breath.   Wt Readings from Last 3 Encounters:  09/02/15 198 lb (89.812 kg)  01/19/15 204 lb 3.2 oz (92.625 kg)  10/13/14 215 lb (97.523 kg)     Past Medical History  Diagnosis Date  . Diabetes ([place])   . 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 Marlou Porch  . Hyperlipidemia   . HTN (hypertension)   . Edema   . Neuropathy ([facility])      feet, hands since 1992- seen neuro in past- idiopathic per prior work up- now sees Dr. Jannifer Franklin- failed Cymbalta  . Gait instability   . Peripheral edema   . DDD (degenerative disc disease)      of spine on CT 03/2011----moderate SS at L3-4-5, multilevel foraminal narrowing  . Glaucoma   . Macular degeneration   . Lumbar spinal stenosis   . Lumbosacral spondylosis     Past  Surgical History  Procedure Laterality Date  . Hernia repair    . Cholecystectomy    . Cataract extraction Bilateral   . Tonsillectomy      Current Outpatient Prescriptions  Medication Sig Dispense Refill  . enalapril (VASOTEC) 10 MG tablet Take 10 mg by mouth daily. Patient takes 1.5 tab daily    . aspirin 81 MG tablet Take 81 mg by mouth daily.    Marland Kitchen atorvastatin (LIPITOR) 10 MG tablet TAKE 1 TABLET BY MOUTH AT BEDTIME 30 tablet 0  . dorzolamide (TRUSOPT) 2 % ophthalmic solution Place 1 drop into both eyes 3 (three) times daily.     . enalapril (VASOTEC) 10 MG tablet Take 1 tablet (10 mg total) by mouth daily. 90 tablet 1  . finasteride (PROSCAR) 5 MG tablet Take 5 mg by mouth daily.    . furosemide (LASIX) 20 MG tablet Take 20 mg by mouth daily.    Marland Kitchen gabapentin (NEURONTIN) 800 MG tablet Take 1,600 mg by mouth 3 (three) times daily.     Marland Kitchen latanoprost (XALATAN) 0.005 % ophthalmic solution Place 0.005 drops into both eyes daily.    . Multiple Vitamin (MULTIVITAMIN) capsule Take 1 capsule by mouth daily.    Marland Kitchen NITRO-BID 2 % ointment Apply 2 g topically daily.    . tamsulosin (FLOMAX) 0.4 MG CAPS capsule Take 0.4 mg by mouth daily.     . traMADol (ULTRAM) 50 MG tablet  Take 50 mg by mouth 2 (two) times daily.     . Vit C-Cholecalciferol-Rose Hip (VITAMIN C & D3/ROSE HIPS) [phone] MG-UNIT-MG CAPS Take 1 capsule by mouth daily.     No current facility-administered medications for this visit.    Allergies:   No Known Allergies  Social History:  The patient  reports that he has never smoked. He has never used smokeless tobacco. He reports that he does not drink alcohol.   ROS:  Please see the history of present illness.   No syncope, no bleeding, no new strokelike symptoms, no chest pain    PHYSICAL EXAM: VS:  BP 150/74 mmHg  Pulse 75  Ht 5\' 10"  (1.778 m)  Wt 198 lb (89.812 kg)  BMI 28.41 kg/m2 Well nourished, well developed, in no acute distress HEENT: normal Neck: no  JVD Cardiac:  normal S1, S2; RRR; no murmurNo ectopy noted Lungs:  clear to auscultation bilaterally, no wheezing, rhonchi or rales Abd: soft, nontender, no hepatomegaly Ext: no edema Skin: warm and dry Neuro: no focal abnormalities noted, ambulates with walker  EKG:  Today 09/02/15-sinus rhythm with PACs, heart rate 75 bpm, right bundle branch block personally viewed-prior 07/31/14-sinus rhythm with sinus arrhythmia, occasional PVC, right bundle branch block, heart rate 73. Previous-Sinus rhythm, 88 with no other changes     Nuclear stress test no evidence of ischemia, normal EF  Holter monitor-10 beat irregular wide-complex tachycardia per report. No syncope. On metoprolol.   ECHO 2011: 1. Normal LV size and function.  2. There were no regional wall motion abnormalities.  3. Left ventricular ejection fraction estimated by 2D at 60-65 percent.  4. Mild left atrial enlargement.  5. Mild mitral annular calcification.  6. Mild mitral valve regurgitation.  7. Trace aortic valve regurgitation.  8. Analysis of mitral valve inflow, pulmonary vein Doppler and tissue Doppler suggests grade I diastolic dysfunction without elevated left atrial pressure.  ASSESSMENT AND PLAN:  1. Hypertension -medications reviewed. Doing very well. No changes made today. Continue with low-sodium diet.I think it would be reasonable to travel to wedding. 2. Prior stroke-aggressive secondary prevention 3. Gait instability-walker. 4. PVC's---Multiple seen on prior EKG. Today none, just rare PAC.  Continue with metoprolol. No changes made. 5. Hyperlipidemia-secondary prevention. atorvastatin 6. 1 year follow-up-no change  Signed, Candee Furbish, MD [facility]  09/02/2015 11:42 AM

## 2015-09-28 ENCOUNTER — Other Ambulatory Visit: Payer: Self-pay | Admitting: Cardiology

## 2015-09-28 DIAGNOSIS — M792 Neuralgia and neuritis, unspecified: Secondary | ICD-10-CM | POA: Diagnosis not present

## 2015-09-28 DIAGNOSIS — E1151 Type 2 diabetes mellitus with diabetic peripheral angiopathy without gangrene: Secondary | ICD-10-CM | POA: Diagnosis not present

## 2015-09-28 DIAGNOSIS — L603 Nail dystrophy: Secondary | ICD-10-CM | POA: Diagnosis not present

## 2015-09-28 DIAGNOSIS — I739 Peripheral vascular disease, unspecified: Secondary | ICD-10-CM | POA: Diagnosis not present

## 2015-09-29 ENCOUNTER — Other Ambulatory Visit: Payer: Self-pay | Admitting: Cardiology

## 2015-10-14 DIAGNOSIS — G3184 Mild cognitive impairment, so stated: Secondary | ICD-10-CM | POA: Diagnosis not present

## 2015-10-14 DIAGNOSIS — R634 Abnormal weight loss: Secondary | ICD-10-CM | POA: Diagnosis not present

## 2015-10-14 DIAGNOSIS — G5602 Carpal tunnel syndrome, left upper limb: Secondary | ICD-10-CM | POA: Diagnosis not present

## 2015-10-14 DIAGNOSIS — R201 Hypoesthesia of skin: Secondary | ICD-10-CM | POA: Diagnosis not present

## 2015-10-14 DIAGNOSIS — G603 Idiopathic progressive neuropathy: Secondary | ICD-10-CM | POA: Diagnosis not present

## 2015-10-14 DIAGNOSIS — R202 Paresthesia of skin: Secondary | ICD-10-CM | POA: Diagnosis not present

## 2015-10-14 DIAGNOSIS — G609 Hereditary and idiopathic neuropathy, unspecified: Secondary | ICD-10-CM | POA: Diagnosis not present

## 2015-10-14 DIAGNOSIS — R413 Other amnesia: Secondary | ICD-10-CM | POA: Diagnosis not present

## 2015-10-14 DIAGNOSIS — G5601 Carpal tunnel syndrome, right upper limb: Secondary | ICD-10-CM | POA: Diagnosis not present

## 2015-10-14 DIAGNOSIS — M5417 Radiculopathy, lumbosacral region: Secondary | ICD-10-CM | POA: Diagnosis not present

## 2015-10-22 DIAGNOSIS — R269 Unspecified abnormalities of gait and mobility: Secondary | ICD-10-CM | POA: Diagnosis not present

## 2015-10-22 DIAGNOSIS — E785 Hyperlipidemia, unspecified: Secondary | ICD-10-CM | POA: Diagnosis not present

## 2015-10-22 DIAGNOSIS — N183 Chronic kidney disease, stage 3 (moderate): Secondary | ICD-10-CM | POA: Diagnosis not present

## 2015-10-22 DIAGNOSIS — Z9181 History of falling: Secondary | ICD-10-CM | POA: Diagnosis not present

## 2015-10-22 DIAGNOSIS — I10 Essential (primary) hypertension: Secondary | ICD-10-CM | POA: Diagnosis not present

## 2015-10-22 DIAGNOSIS — N400 Enlarged prostate without lower urinary tract symptoms: Secondary | ICD-10-CM | POA: Diagnosis not present

## 2015-10-22 DIAGNOSIS — G609 Hereditary and idiopathic neuropathy, unspecified: Secondary | ICD-10-CM | POA: Diagnosis not present

## 2015-10-22 DIAGNOSIS — E119 Type 2 diabetes mellitus without complications: Secondary | ICD-10-CM | POA: Diagnosis not present

## 2015-11-11 DIAGNOSIS — G603 Idiopathic progressive neuropathy: Secondary | ICD-10-CM | POA: Diagnosis not present

## 2015-11-11 DIAGNOSIS — M5442 Lumbago with sciatica, left side: Secondary | ICD-10-CM | POA: Diagnosis not present

## 2015-11-11 DIAGNOSIS — G5603 Carpal tunnel syndrome, bilateral upper limbs: Secondary | ICD-10-CM | POA: Diagnosis not present

## 2015-11-11 DIAGNOSIS — G3184 Mild cognitive impairment, so stated: Secondary | ICD-10-CM | POA: Diagnosis not present

## 2015-11-11 DIAGNOSIS — M5441 Lumbago with sciatica, right side: Secondary | ICD-10-CM | POA: Diagnosis not present

## 2015-12-03 DIAGNOSIS — Z87448 Personal history of other diseases of urinary system: Secondary | ICD-10-CM | POA: Diagnosis not present

## 2015-12-03 DIAGNOSIS — Z0000 Encounter for general adult medical examination without abnormal findings: Secondary | ICD-10-CM | POA: Diagnosis not present

## 2015-12-03 DIAGNOSIS — N403 Nodular prostate with lower urinary tract symptoms: Secondary | ICD-10-CM | POA: Diagnosis not present

## 2015-12-21 DIAGNOSIS — L603 Nail dystrophy: Secondary | ICD-10-CM | POA: Diagnosis not present

## 2015-12-21 DIAGNOSIS — M79609 Pain in unspecified limb: Secondary | ICD-10-CM | POA: Diagnosis not present

## 2015-12-21 DIAGNOSIS — E1151 Type 2 diabetes mellitus with diabetic peripheral angiopathy without gangrene: Secondary | ICD-10-CM | POA: Diagnosis not present

## 2015-12-21 DIAGNOSIS — I739 Peripheral vascular disease, unspecified: Secondary | ICD-10-CM | POA: Diagnosis not present

## 2015-12-21 DIAGNOSIS — B351 Tinea unguium: Secondary | ICD-10-CM | POA: Diagnosis not present

## 2015-12-23 DIAGNOSIS — M5442 Lumbago with sciatica, left side: Secondary | ICD-10-CM | POA: Diagnosis not present

## 2015-12-23 DIAGNOSIS — G5601 Carpal tunnel syndrome, right upper limb: Secondary | ICD-10-CM | POA: Diagnosis not present

## 2015-12-23 DIAGNOSIS — G5602 Carpal tunnel syndrome, left upper limb: Secondary | ICD-10-CM | POA: Diagnosis not present

## 2015-12-23 DIAGNOSIS — M5417 Radiculopathy, lumbosacral region: Secondary | ICD-10-CM | POA: Diagnosis not present

## 2015-12-23 DIAGNOSIS — G603 Idiopathic progressive neuropathy: Secondary | ICD-10-CM | POA: Diagnosis not present

## 2015-12-23 DIAGNOSIS — M5441 Lumbago with sciatica, right side: Secondary | ICD-10-CM | POA: Diagnosis not present

## 2016-01-01 ENCOUNTER — Emergency Department (HOSPITAL_BASED_OUTPATIENT_CLINIC_OR_DEPARTMENT_OTHER): Payer: Medicare Other

## 2016-01-01 ENCOUNTER — Emergency Department (HOSPITAL_BASED_OUTPATIENT_CLINIC_OR_DEPARTMENT_OTHER)
Admission: EM | Admit: 2016-01-01 | Discharge: 2016-01-01 | Disposition: A | Discharge status: Home / Self Care | Payer: Medicare Other | Attending: Emergency Medicine | Admitting: Emergency Medicine

## 2016-01-01 DIAGNOSIS — Z79899 Other long term (current) drug therapy: Secondary | ICD-10-CM | POA: Insufficient documentation

## 2016-01-01 DIAGNOSIS — Y929 Unspecified place or not applicable: Secondary | ICD-10-CM | POA: Diagnosis not present

## 2016-01-01 DIAGNOSIS — Y9384 Activity, sleeping: Secondary | ICD-10-CM | POA: Diagnosis not present

## 2016-01-01 DIAGNOSIS — E119 Type 2 diabetes mellitus without complications: Secondary | ICD-10-CM | POA: Insufficient documentation

## 2016-01-01 DIAGNOSIS — S0990XA Unspecified injury of head, initial encounter: Secondary | ICD-10-CM | POA: Diagnosis not present

## 2016-01-01 DIAGNOSIS — W06XXXA Fall from bed, initial encounter: Secondary | ICD-10-CM | POA: Diagnosis not present

## 2016-01-01 DIAGNOSIS — E785 Hyperlipidemia, unspecified: Secondary | ICD-10-CM | POA: Insufficient documentation

## 2016-01-01 DIAGNOSIS — I10 Essential (primary) hypertension: Secondary | ICD-10-CM | POA: Diagnosis not present

## 2016-01-01 DIAGNOSIS — W19XXXA Unspecified fall, initial encounter: Secondary | ICD-10-CM

## 2016-01-01 DIAGNOSIS — Y999 Unspecified external cause status: Secondary | ICD-10-CM | POA: Insufficient documentation

## 2016-01-01 DIAGNOSIS — S0181XA Laceration without foreign body of other part of head, initial encounter: Secondary | ICD-10-CM | POA: Diagnosis not present

## 2016-01-01 DIAGNOSIS — S0101XA Laceration without foreign body of scalp, initial encounter: Secondary | ICD-10-CM | POA: Diagnosis not present

## 2016-01-01 MED ORDER — LIDOCAINE HCL 2 % IJ SOLN
20.0000 mL | Freq: Once | INTRAMUSCULAR | Status: AC
  Administered 2016-01-01: 400 mg via INTRADERMAL
  Filled 2016-01-01: qty 20

## 2016-01-01 NOTE — Discharge Instructions (Signed)
Have Staples out in 7 days.  Gently clean the area was soap and water.  Follow-up with your primary care doctor

## 2016-01-01 NOTE — ED Provider Notes (Signed)
CSN: [id number]     Arrival date & time 01/01/16  1012 History   First MD Initiated Contact with Patient 01/01/16 1039     Chief Complaint  Patient presents with  . Fall     (Consider location/radiation/quality/duration/timing/severity/associated sxs/prior Treatment) HPI Patient presents to the emergency department following a fall occurred prior to arrival.  The patient states that he was sitting on the edge of the bed when he fell asleep and fell forward striking his head against the dresser.  The patient states that he has a laceration to the left scalp.  The patient states that he did not pass out.  He states that he woke up early this morning and was tired while sitting on the edge of the bed, states that he fell asleep sitting up.  The patient states that he applied pressure to the laceration to help control the bleeding.  The patient states that nothing seems make the condition better or worse.  He states that he has a mild headache but no other injuriesThe patient denies chest pain, shortness of breath,blurred vision, neck pain, fever, cough, weakness, numbness, dizziness, anorexia, edema, abdominal pain, nausea, vomiting, diarrhea, rash, back pain, dysuria, hematemesis, bloody stool, near syncope, or syncope. Past Medical History  Diagnosis Date  . Diabetes ([place])   . 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 Marlou Porch  . Hyperlipidemia   . HTN (hypertension)   . Edema   . Neuropathy ([facility])      feet, hands since 1992- seen neuro in past- idiopathic per prior work up- now sees Dr. Jannifer Franklin- failed Cymbalta  . Gait instability   . Peripheral edema   . DDD (degenerative disc disease)      of spine on CT 03/2011----moderate SS at L3-4-5, multilevel foraminal narrowing  . Glaucoma   . Macular degeneration   . Lumbar spinal stenosis   . Lumbosacral spondylosis    Past Surgical History  Procedure Laterality Date  . Hernia repair    . Cholecystectomy    .  Cataract extraction Bilateral   . Tonsillectomy     Family History  Problem Relation Age of Onset  . Hodgkin's lymphoma Daughter   . Dementia Mother   . Heart attack    . Heart attack Brother    Social History  Substance Use Topics  . Smoking status: Never Smoker   . Smokeless tobacco: Never Used  . Alcohol Use: No    Review of Systems  All other systems negative except as documented in the HPI. All pertinent positives and negatives as reviewed in the HPI.  Allergies  Review of patient's allergies indicates no known allergies.  Home Medications   Prior to Admission medications   Medication Sig Start Date End Date Taking? Authorizing Provider  alfuzosin (UROXATRAL) 10 MG 24 hr tablet Take 10 mg by mouth daily with breakfast.   Yes Historical Provider, MD  aspirin 81 MG tablet Take 81 mg by mouth daily.    Historical Provider, MD  atorvastatin (LIPITOR) 10 MG tablet TAKE 1 TABLET BY MOUTH AT BEDTIME 09/28/15   Jerline Pain, MD  dorzolamide (TRUSOPT) 2 % ophthalmic solution Place 1 drop into both eyes 3 (three) times daily.     Historical Provider, MD  enalapril (VASOTEC) 10 MG tablet Take 10 mg by mouth daily. Patient takes 1.5 tab daily    Historical Provider, MD  finasteride (PROSCAR) 5 MG tablet Take 5 mg by mouth daily. 09/22/13  Historical Provider, MD  furosemide (LASIX) 20 MG tablet Take 20 mg by mouth daily. 07/17/13   Historical Provider, MD  gabapentin (NEURONTIN) 800 MG tablet Take 1,600 mg by mouth 3 (three) times daily.  07/17/13   Historical Provider, MD  latanoprost (XALATAN) 0.005 % ophthalmic solution Place 0.005 drops into both eyes daily. 09/12/13   Historical Provider, MD  Multiple Vitamin (MULTIVITAMIN) capsule Take 1 capsule by mouth daily.    Historical Provider, MD  NITRO-BID 2 % ointment Apply 2 g topically daily. 09/23/13   Historical Provider, MD  tamsulosin (FLOMAX) 0.4 MG CAPS capsule Take 0.4 mg by mouth daily.  04/10/13   Historical Provider, MD    traMADol (ULTRAM) 50 MG tablet Take 50 mg by mouth 2 (two) times daily.  07/17/14   Historical Provider, MD  Vit C-Cholecalciferol-Rose Hip (VITAMIN C & D3/ROSE HIPS) [id number] MG-UNIT-MG CAPS Take 1 capsule by mouth daily.    Historical Provider, MD   BP 160/85 mmHg  Pulse 96  Temp(Src) 97.8 F (36.6 C) (Oral)  Resp 20  Ht 5' 10.5" (1.791 m)  Wt 85.276 kg  BMI 26.58 kg/m2  SpO2 100% Physical Exam  Constitutional: He is oriented to person, place, and time. He appears well-developed and well-nourished. No distress.  HENT:  Head: Normocephalic.    Mouth/Throat: Oropharynx is clear and moist.  Eyes: EOM are normal. Pupils are equal, round, and reactive to light.  Neck: Normal range of motion. Neck supple.  Cardiovascular: Normal rate, regular rhythm and normal heart sounds.  Exam reveals no gallop and no friction rub.   No murmur heard. Pulmonary/Chest: Effort normal and breath sounds normal. No respiratory distress.  Musculoskeletal:       Right hip: Normal.       Left hip: Normal.       Cervical back: He exhibits tenderness and pain. He exhibits normal range of motion, no bony tenderness, no swelling, no deformity and no spasm.  Neurological: He is alert and oriented to person, place, and time. He exhibits normal muscle tone. Coordination normal.  Skin: Skin is warm and dry. No rash noted. No erythema.  Nursing note and vitals reviewed.   ED Course  Procedures (including critical care time) Labs Review Labs Reviewed - No data to display  Imaging Review Ct Head Wo Contrast  01/01/2016  CLINICAL DATA:  Patient status post fall from seated position. Forehead laceration. EXAM: CT HEAD WITHOUT CONTRAST CT CERVICAL SPINE WITHOUT CONTRAST TECHNIQUE: Multidetector CT imaging of the head and cervical spine was performed following the standard protocol without intravenous contrast. Multiplanar CT image reconstructions of the cervical spine were also generated. COMPARISON:  MRI brain  03/16/2014. FINDINGS: CT HEAD FINDINGS Ventricles and sulci are prominent compatible with atrophy. Periventricular and subcortical white matter hypodensity compatible with chronic microvascular ischemic changes. No evidence for acute cortically based infarct, intracranial hemorrhage, mass lesion or mass-effect. Orbits are unremarkable. Paranasal sinuses are well aerated. Mastoid air cells are unremarkable. Calvarium is intact. CT CERVICAL SPINE FINDINGS Normal anatomic alignment. Multilevel degenerative disc disease most pronounced C5-6 and C6-7 with fusion at these levels. Posterior disc osteophyte complexes and ossification of the posterior longitudinal ligament at these levels narrow the spinal canal. Multilevel facet degenerative changes. Craniocervical junction is intact. Biapical pleural parenchymal thickening and scarring. Prevertebral soft tissues are unremarkable. IMPRESSION: No acute intracranial process. No acute cervical spine fracture. Cortical atrophy and chronic microvascular ischemic change. Degenerative disc disease and fusion at the C5-6 and C6-7 levels. There  is marked spinal canal narrowing at these levels. Electronically Signed   By: Lovey Newcomer M.D.   On: 01/01/2016 12:07   Ct Cervical Spine Wo Contrast  01/01/2016  CLINICAL DATA:  Patient status post fall from seated position. Forehead laceration. EXAM: CT HEAD WITHOUT CONTRAST CT CERVICAL SPINE WITHOUT CONTRAST TECHNIQUE: Multidetector CT imaging of the head and cervical spine was performed following the standard protocol without intravenous contrast. Multiplanar CT image reconstructions of the cervical spine were also generated. COMPARISON:  MRI brain 03/16/2014. FINDINGS: CT HEAD FINDINGS Ventricles and sulci are prominent compatible with atrophy. Periventricular and subcortical white matter hypodensity compatible with chronic microvascular ischemic changes. No evidence for acute cortically based infarct, intracranial hemorrhage, mass  lesion or mass-effect. Orbits are unremarkable. Paranasal sinuses are well aerated. Mastoid air cells are unremarkable. Calvarium is intact. CT CERVICAL SPINE FINDINGS Normal anatomic alignment. Multilevel degenerative disc disease most pronounced C5-6 and C6-7 with fusion at these levels. Posterior disc osteophyte complexes and ossification of the posterior longitudinal ligament at these levels narrow the spinal canal. Multilevel facet degenerative changes. Craniocervical junction is intact. Biapical pleural parenchymal thickening and scarring. Prevertebral soft tissues are unremarkable. IMPRESSION: No acute intracranial process. No acute cervical spine fracture. Cortical atrophy and chronic microvascular ischemic change. Degenerative disc disease and fusion at the C5-6 and C6-7 levels. There is marked spinal canal narrowing at these levels. Electronically Signed   By: Lovey Newcomer M.D.   On: 01/01/2016 12:07   I have personally reviewed and evaluated these images and lab results as part of my medical decision-making.  LACERATION REPAIR Performed by: Brent General Authorized by: Brent General Consent: Verbal consent obtained. Risks and benefits: risks, benefits and alternatives were discussed Consent given by: patient Patient identity confirmed: provided demographic data Prepped and Draped in normal sterile fashion Wound explored  Laceration Location: L lateral scalp  Laceration Length: 3.4 cm  No Foreign Bodies seen or palpated  Anesthesia: local infiltration  Local anesthetic: lidocaine 2% wo epinephrine  Anesthetic total: 4 ml  Irrigation method: syringe Amount of cleaning: standard  Skin closure: staples  Number of staples: 6  Technique: staples  Patient tolerance: Patient tolerated the procedure well with no immediate complications.  Patient is advised to return here for any worsening in his condition.  Told to follow-up with his primary care doctor to have  Staples out in 7 days.  Patient agrees the plan and all questions are answered.  He did not have any neurological deficits noted on exam.   Dalia Heading, PA-C 01/01/16 [place] Liu, MD 01/02/16 [phone]

## 2016-01-01 NOTE — ED Notes (Signed)
Pt does not recall last tetanus

## 2016-01-01 NOTE — ED Notes (Signed)
Area cleaned with sterile water

## 2016-01-01 NOTE — ED Notes (Signed)
Patient transported to CT via stretcher, sr x 2 up  

## 2016-01-01 NOTE — ED Notes (Signed)
States fell off bed this am and hit left side of head on bedside table, presents with laceration to left side of head, bleeding controlled. Denies any LOC, denies any dizziness at this time

## 2016-01-01 NOTE — ED Notes (Signed)
Patient fell asleep and fell over and hit head on the furniture. Laceration to left side of his head, bleeding controlled

## 2016-01-10 DIAGNOSIS — S0101XD Laceration without foreign body of scalp, subsequent encounter: Secondary | ICD-10-CM | POA: Diagnosis not present

## 2016-01-14 DIAGNOSIS — Z961 Presence of intraocular lens: Secondary | ICD-10-CM | POA: Diagnosis not present

## 2016-01-14 DIAGNOSIS — H02839 Dermatochalasis of unspecified eye, unspecified eyelid: Secondary | ICD-10-CM | POA: Diagnosis not present

## 2016-01-14 DIAGNOSIS — H409 Unspecified glaucoma: Secondary | ICD-10-CM | POA: Diagnosis not present

## 2016-01-27 DIAGNOSIS — H353122 Nonexudative age-related macular degeneration, left eye, intermediate dry stage: Secondary | ICD-10-CM | POA: Diagnosis not present

## 2016-01-27 DIAGNOSIS — H353112 Nonexudative age-related macular degeneration, right eye, intermediate dry stage: Secondary | ICD-10-CM | POA: Diagnosis not present

## 2016-01-27 DIAGNOSIS — H52223 Regular astigmatism, bilateral: Secondary | ICD-10-CM | POA: Diagnosis not present

## 2016-01-27 DIAGNOSIS — E119 Type 2 diabetes mellitus without complications: Secondary | ICD-10-CM | POA: Diagnosis not present

## 2016-01-27 DIAGNOSIS — H401112 Primary open-angle glaucoma, right eye, moderate stage: Secondary | ICD-10-CM | POA: Diagnosis not present

## 2016-01-27 DIAGNOSIS — H401122 Primary open-angle glaucoma, left eye, moderate stage: Secondary | ICD-10-CM | POA: Diagnosis not present

## 2016-01-27 DIAGNOSIS — H5202 Hypermetropia, left eye: Secondary | ICD-10-CM | POA: Diagnosis not present

## 2016-02-03 DIAGNOSIS — M5441 Lumbago with sciatica, right side: Secondary | ICD-10-CM | POA: Diagnosis not present

## 2016-02-03 DIAGNOSIS — G5602 Carpal tunnel syndrome, left upper limb: Secondary | ICD-10-CM | POA: Diagnosis not present

## 2016-02-03 DIAGNOSIS — M5442 Lumbago with sciatica, left side: Secondary | ICD-10-CM | POA: Diagnosis not present

## 2016-02-03 DIAGNOSIS — G603 Idiopathic progressive neuropathy: Secondary | ICD-10-CM | POA: Diagnosis not present

## 2016-02-03 DIAGNOSIS — G5601 Carpal tunnel syndrome, right upper limb: Secondary | ICD-10-CM | POA: Diagnosis not present

## 2016-02-22 DIAGNOSIS — N400 Enlarged prostate without lower urinary tract symptoms: Secondary | ICD-10-CM | POA: Diagnosis not present

## 2016-02-22 DIAGNOSIS — E119 Type 2 diabetes mellitus without complications: Secondary | ICD-10-CM | POA: Diagnosis not present

## 2016-02-22 DIAGNOSIS — I10 Essential (primary) hypertension: Secondary | ICD-10-CM | POA: Diagnosis not present

## 2016-02-22 DIAGNOSIS — E785 Hyperlipidemia, unspecified: Secondary | ICD-10-CM | POA: Diagnosis not present

## 2016-03-14 DIAGNOSIS — L03115 Cellulitis of right lower limb: Secondary | ICD-10-CM | POA: Diagnosis not present

## 2016-03-14 DIAGNOSIS — L089 Local infection of the skin and subcutaneous tissue, unspecified: Secondary | ICD-10-CM | POA: Diagnosis not present

## 2016-04-03 DIAGNOSIS — L603 Nail dystrophy: Secondary | ICD-10-CM | POA: Diagnosis not present

## 2016-04-03 DIAGNOSIS — I739 Peripheral vascular disease, unspecified: Secondary | ICD-10-CM | POA: Diagnosis not present

## 2016-04-03 DIAGNOSIS — L97529 Non-pressure chronic ulcer of other part of left foot with unspecified severity: Secondary | ICD-10-CM | POA: Diagnosis not present

## 2016-04-20 DIAGNOSIS — E1151 Type 2 diabetes mellitus with diabetic peripheral angiopathy without gangrene: Secondary | ICD-10-CM | POA: Diagnosis not present

## 2016-04-20 DIAGNOSIS — L97522 Non-pressure chronic ulcer of other part of left foot with fat layer exposed: Secondary | ICD-10-CM | POA: Diagnosis not present

## 2016-05-04 DIAGNOSIS — L97522 Non-pressure chronic ulcer of other part of left foot with fat layer exposed: Secondary | ICD-10-CM | POA: Diagnosis not present

## 2016-05-04 DIAGNOSIS — E1151 Type 2 diabetes mellitus with diabetic peripheral angiopathy without gangrene: Secondary | ICD-10-CM | POA: Diagnosis not present

## 2016-05-10 ENCOUNTER — Telehealth: Payer: Self-pay | Admitting: Cardiology

## 2016-05-10 NOTE — Telephone Encounter (Signed)
New Message:    Pt have moved to [place]. She would like Dr Marlou Porch to refer him to Cardiologist there please.

## 2016-05-16 ENCOUNTER — Telehealth: Payer: Self-pay | Admitting: Cardiology

## 2016-05-16 DIAGNOSIS — I10 Essential (primary) hypertension: Secondary | ICD-10-CM | POA: Diagnosis not present

## 2016-05-16 DIAGNOSIS — E119 Type 2 diabetes mellitus without complications: Secondary | ICD-10-CM | POA: Diagnosis not present

## 2016-05-16 DIAGNOSIS — Z79899 Other long term (current) drug therapy: Secondary | ICD-10-CM | POA: Diagnosis not present

## 2016-05-16 DIAGNOSIS — I872 Venous insufficiency (chronic) (peripheral): Secondary | ICD-10-CM | POA: Diagnosis not present

## 2016-05-16 DIAGNOSIS — I4891 Unspecified atrial fibrillation: Secondary | ICD-10-CM | POA: Diagnosis not present

## 2016-05-16 DIAGNOSIS — E785 Hyperlipidemia, unspecified: Secondary | ICD-10-CM | POA: Diagnosis not present

## 2016-05-16 NOTE — Telephone Encounter (Signed)
New Message  Pts daughter voiced to have medical records faxed over to:  [facility] in [place], [place] [phone]-fax

## 2016-06-01 DIAGNOSIS — I4891 Unspecified atrial fibrillation: Secondary | ICD-10-CM | POA: Diagnosis not present

## 2016-06-01 DIAGNOSIS — I10 Essential (primary) hypertension: Secondary | ICD-10-CM | POA: Diagnosis not present

## 2016-06-07 DIAGNOSIS — R110 Nausea: Secondary | ICD-10-CM | POA: Diagnosis not present

## 2016-06-07 DIAGNOSIS — Z23 Encounter for immunization: Secondary | ICD-10-CM | POA: Diagnosis not present

## 2016-06-07 DIAGNOSIS — R0602 Shortness of breath: Secondary | ICD-10-CM | POA: Diagnosis not present

## 2016-06-12 DIAGNOSIS — I481 Persistent atrial fibrillation: Secondary | ICD-10-CM | POA: Diagnosis not present

## 2016-06-12 DIAGNOSIS — R0989 Other specified symptoms and signs involving the circulatory and respiratory systems: Secondary | ICD-10-CM | POA: Diagnosis not present

## 2016-06-12 DIAGNOSIS — J189 Pneumonia, unspecified organism: Secondary | ICD-10-CM | POA: Diagnosis not present

## 2016-06-12 DIAGNOSIS — I495 Sick sinus syndrome: Secondary | ICD-10-CM | POA: Diagnosis present

## 2016-06-12 DIAGNOSIS — Z950 Presence of cardiac pacemaker: Secondary | ICD-10-CM | POA: Diagnosis not present

## 2016-06-12 DIAGNOSIS — E8770 Fluid overload, unspecified: Secondary | ICD-10-CM | POA: Diagnosis present

## 2016-06-12 DIAGNOSIS — G629 Polyneuropathy, unspecified: Secondary | ICD-10-CM | POA: Diagnosis present

## 2016-06-12 DIAGNOSIS — R4781 Slurred speech: Secondary | ICD-10-CM | POA: Diagnosis present

## 2016-06-12 DIAGNOSIS — R0602 Shortness of breath: Secondary | ICD-10-CM | POA: Diagnosis not present

## 2016-06-12 DIAGNOSIS — N189 Chronic kidney disease, unspecified: Secondary | ICD-10-CM | POA: Diagnosis present

## 2016-06-12 DIAGNOSIS — R609 Edema, unspecified: Secondary | ICD-10-CM | POA: Diagnosis not present

## 2016-06-12 DIAGNOSIS — T17300D Unspecified foreign body in larynx causing asphyxiation, subsequent encounter: Secondary | ICD-10-CM | POA: Diagnosis not present

## 2016-06-12 DIAGNOSIS — Z48812 Encounter for surgical aftercare following surgery on the circulatory system: Secondary | ICD-10-CM | POA: Diagnosis not present

## 2016-06-12 DIAGNOSIS — Q211 Atrial septal defect: Secondary | ICD-10-CM | POA: Diagnosis not present

## 2016-06-12 DIAGNOSIS — Z466 Encounter for fitting and adjustment of urinary device: Secondary | ICD-10-CM | POA: Diagnosis not present

## 2016-06-12 DIAGNOSIS — J9811 Atelectasis: Secondary | ICD-10-CM | POA: Diagnosis not present

## 2016-06-12 DIAGNOSIS — N401 Enlarged prostate with lower urinary tract symptoms: Secondary | ICD-10-CM | POA: Diagnosis not present

## 2016-06-12 DIAGNOSIS — R0600 Dyspnea, unspecified: Secondary | ICD-10-CM | POA: Diagnosis not present

## 2016-06-12 DIAGNOSIS — I482 Chronic atrial fibrillation: Secondary | ICD-10-CM | POA: Diagnosis not present

## 2016-06-12 DIAGNOSIS — R1310 Dysphagia, unspecified: Secondary | ICD-10-CM | POA: Diagnosis present

## 2016-06-12 DIAGNOSIS — I82612 Acute embolism and thrombosis of superficial veins of left upper extremity: Secondary | ICD-10-CM | POA: Diagnosis not present

## 2016-06-12 DIAGNOSIS — I82409 Acute embolism and thrombosis of unspecified deep veins of unspecified lower extremity: Secondary | ICD-10-CM | POA: Diagnosis not present

## 2016-06-12 DIAGNOSIS — R600 Localized edema: Secondary | ICD-10-CM | POA: Diagnosis not present

## 2016-06-12 DIAGNOSIS — I5041 Acute combined systolic (congestive) and diastolic (congestive) heart failure: Secondary | ICD-10-CM | POA: Diagnosis present

## 2016-06-12 DIAGNOSIS — Z9181 History of falling: Secondary | ICD-10-CM | POA: Diagnosis not present

## 2016-06-12 DIAGNOSIS — R531 Weakness: Secondary | ICD-10-CM | POA: Diagnosis not present

## 2016-06-12 DIAGNOSIS — E876 Hypokalemia: Secondary | ICD-10-CM | POA: Diagnosis present

## 2016-06-12 DIAGNOSIS — N179 Acute kidney failure, unspecified: Secondary | ICD-10-CM | POA: Diagnosis not present

## 2016-06-12 DIAGNOSIS — I429 Cardiomyopathy, unspecified: Secondary | ICD-10-CM | POA: Diagnosis not present

## 2016-06-12 DIAGNOSIS — G9340 Encephalopathy, unspecified: Secondary | ICD-10-CM | POA: Diagnosis present

## 2016-06-12 DIAGNOSIS — F0390 Unspecified dementia without behavioral disturbance: Secondary | ICD-10-CM | POA: Diagnosis present

## 2016-06-12 DIAGNOSIS — Z515 Encounter for palliative care: Secondary | ICD-10-CM | POA: Diagnosis present

## 2016-06-12 DIAGNOSIS — J90 Pleural effusion, not elsewhere classified: Secondary | ICD-10-CM | POA: Diagnosis not present

## 2016-06-12 DIAGNOSIS — R410 Disorientation, unspecified: Secondary | ICD-10-CM | POA: Diagnosis not present

## 2016-06-12 DIAGNOSIS — Z9889 Other specified postprocedural states: Secondary | ICD-10-CM | POA: Diagnosis not present

## 2016-06-12 DIAGNOSIS — I130 Hypertensive heart and chronic kidney disease with heart failure and stage 1 through stage 4 chronic kidney disease, or unspecified chronic kidney disease: Secondary | ICD-10-CM | POA: Diagnosis present

## 2016-06-12 DIAGNOSIS — I4891 Unspecified atrial fibrillation: Secondary | ICD-10-CM | POA: Diagnosis not present

## 2016-06-12 DIAGNOSIS — E785 Hyperlipidemia, unspecified: Secondary | ICD-10-CM | POA: Diagnosis present

## 2016-06-12 DIAGNOSIS — R7303 Prediabetes: Secondary | ICD-10-CM | POA: Diagnosis present

## 2016-06-12 DIAGNOSIS — R55 Syncope and collapse: Secondary | ICD-10-CM | POA: Diagnosis not present

## 2016-06-12 DIAGNOSIS — R000 Tachycardia, unspecified: Secondary | ICD-10-CM | POA: Diagnosis not present

## 2016-06-12 DIAGNOSIS — Z45018 Encounter for adjustment and management of other part of cardiac pacemaker: Secondary | ICD-10-CM | POA: Diagnosis not present

## 2016-06-12 DIAGNOSIS — R079 Chest pain, unspecified: Secondary | ICD-10-CM | POA: Diagnosis not present

## 2016-06-12 DIAGNOSIS — I872 Venous insufficiency (chronic) (peripheral): Secondary | ICD-10-CM | POA: Diagnosis present

## 2016-06-12 DIAGNOSIS — R296 Repeated falls: Secondary | ICD-10-CM | POA: Diagnosis present

## 2016-06-12 DIAGNOSIS — I82622 Acute embolism and thrombosis of deep veins of left upper extremity: Secondary | ICD-10-CM | POA: Diagnosis not present

## 2016-06-22 DIAGNOSIS — R0602 Shortness of breath: Secondary | ICD-10-CM | POA: Diagnosis not present

## 2016-06-27 DIAGNOSIS — Z9181 History of falling: Secondary | ICD-10-CM | POA: Diagnosis not present

## 2016-06-27 DIAGNOSIS — G8929 Other chronic pain: Secondary | ICD-10-CM | POA: Diagnosis present

## 2016-06-27 DIAGNOSIS — G629 Polyneuropathy, unspecified: Secondary | ICD-10-CM | POA: Diagnosis present

## 2016-06-27 DIAGNOSIS — R600 Localized edema: Secondary | ICD-10-CM | POA: Diagnosis not present

## 2016-06-27 DIAGNOSIS — R1310 Dysphagia, unspecified: Secondary | ICD-10-CM | POA: Diagnosis present

## 2016-06-27 DIAGNOSIS — R269 Unspecified abnormalities of gait and mobility: Secondary | ICD-10-CM | POA: Diagnosis present

## 2016-06-27 DIAGNOSIS — I481 Persistent atrial fibrillation: Secondary | ICD-10-CM | POA: Diagnosis not present

## 2016-06-27 DIAGNOSIS — I4891 Unspecified atrial fibrillation: Secondary | ICD-10-CM | POA: Diagnosis not present

## 2016-06-27 DIAGNOSIS — I82622 Acute embolism and thrombosis of deep veins of left upper extremity: Secondary | ICD-10-CM | POA: Diagnosis not present

## 2016-06-27 DIAGNOSIS — M549 Dorsalgia, unspecified: Secondary | ICD-10-CM | POA: Diagnosis present

## 2016-06-27 DIAGNOSIS — J90 Pleural effusion, not elsewhere classified: Secondary | ICD-10-CM | POA: Diagnosis present

## 2016-06-27 DIAGNOSIS — E1122 Type 2 diabetes mellitus with diabetic chronic kidney disease: Secondary | ICD-10-CM | POA: Diagnosis present

## 2016-06-27 DIAGNOSIS — H5340 Unspecified visual field defects: Secondary | ICD-10-CM | POA: Diagnosis not present

## 2016-06-27 DIAGNOSIS — R630 Anorexia: Secondary | ICD-10-CM | POA: Diagnosis present

## 2016-06-27 DIAGNOSIS — I482 Chronic atrial fibrillation: Secondary | ICD-10-CM | POA: Diagnosis not present

## 2016-06-27 DIAGNOSIS — N189 Chronic kidney disease, unspecified: Secondary | ICD-10-CM | POA: Diagnosis present

## 2016-06-27 DIAGNOSIS — E785 Hyperlipidemia, unspecified: Secondary | ICD-10-CM | POA: Diagnosis present

## 2016-06-27 DIAGNOSIS — I82C12 Acute embolism and thrombosis of left internal jugular vein: Secondary | ICD-10-CM | POA: Diagnosis present

## 2016-06-27 DIAGNOSIS — I129 Hypertensive chronic kidney disease with stage 1 through stage 4 chronic kidney disease, or unspecified chronic kidney disease: Secondary | ICD-10-CM | POA: Diagnosis present

## 2016-06-27 DIAGNOSIS — R634 Abnormal weight loss: Secondary | ICD-10-CM | POA: Diagnosis present

## 2016-06-27 DIAGNOSIS — I82629 Acute embolism and thrombosis of deep veins of unspecified upper extremity: Secondary | ICD-10-CM | POA: Diagnosis not present

## 2016-06-27 DIAGNOSIS — I495 Sick sinus syndrome: Secondary | ICD-10-CM | POA: Diagnosis not present

## 2016-06-27 DIAGNOSIS — R1314 Dysphagia, pharyngoesophageal phase: Secondary | ICD-10-CM | POA: Diagnosis not present

## 2016-06-27 DIAGNOSIS — Z6825 Body mass index (BMI) 25.0-25.9, adult: Secondary | ICD-10-CM | POA: Diagnosis not present

## 2016-06-27 DIAGNOSIS — I69398 Other sequelae of cerebral infarction: Secondary | ICD-10-CM | POA: Diagnosis not present

## 2016-06-27 DIAGNOSIS — I82409 Acute embolism and thrombosis of unspecified deep veins of unspecified lower extremity: Secondary | ICD-10-CM | POA: Diagnosis not present

## 2016-06-27 DIAGNOSIS — M7989 Other specified soft tissue disorders: Secondary | ICD-10-CM | POA: Diagnosis not present

## 2016-06-27 DIAGNOSIS — K5900 Constipation, unspecified: Secondary | ICD-10-CM | POA: Diagnosis not present

## 2016-06-27 DIAGNOSIS — L98499 Non-pressure chronic ulcer of skin of other sites with unspecified severity: Secondary | ICD-10-CM | POA: Diagnosis not present

## 2016-06-27 DIAGNOSIS — Z7901 Long term (current) use of anticoagulants: Secondary | ICD-10-CM | POA: Diagnosis not present

## 2016-06-27 DIAGNOSIS — R531 Weakness: Secondary | ICD-10-CM | POA: Diagnosis not present

## 2016-06-27 DIAGNOSIS — I5022 Chronic systolic (congestive) heart failure: Secondary | ICD-10-CM | POA: Diagnosis not present

## 2016-06-27 DIAGNOSIS — N401 Enlarged prostate with lower urinary tract symptoms: Secondary | ICD-10-CM | POA: Diagnosis not present

## 2016-06-27 DIAGNOSIS — I69391 Dysphagia following cerebral infarction: Secondary | ICD-10-CM | POA: Diagnosis not present

## 2016-06-27 DIAGNOSIS — Z48812 Encounter for surgical aftercare following surgery on the circulatory system: Secondary | ICD-10-CM | POA: Diagnosis not present

## 2016-06-27 DIAGNOSIS — R2681 Unsteadiness on feet: Secondary | ICD-10-CM | POA: Diagnosis not present

## 2016-06-27 DIAGNOSIS — R5381 Other malaise: Secondary | ICD-10-CM | POA: Diagnosis not present

## 2016-06-27 DIAGNOSIS — B353 Tinea pedis: Secondary | ICD-10-CM | POA: Diagnosis not present

## 2016-06-27 DIAGNOSIS — Z950 Presence of cardiac pacemaker: Secondary | ICD-10-CM | POA: Diagnosis not present

## 2016-06-27 DIAGNOSIS — N400 Enlarged prostate without lower urinary tract symptoms: Secondary | ICD-10-CM | POA: Diagnosis present

## 2016-06-27 DIAGNOSIS — I82A12 Acute embolism and thrombosis of left axillary vein: Secondary | ICD-10-CM | POA: Diagnosis not present

## 2016-06-27 DIAGNOSIS — I872 Venous insufficiency (chronic) (peripheral): Secondary | ICD-10-CM | POA: Diagnosis present

## 2016-06-27 DIAGNOSIS — D539 Nutritional anemia, unspecified: Secondary | ICD-10-CM | POA: Diagnosis present

## 2016-06-27 DIAGNOSIS — M1990 Unspecified osteoarthritis, unspecified site: Secondary | ICD-10-CM | POA: Diagnosis present

## 2016-06-27 DIAGNOSIS — Z466 Encounter for fitting and adjustment of urinary device: Secondary | ICD-10-CM | POA: Diagnosis not present

## 2016-06-27 DIAGNOSIS — R7303 Prediabetes: Secondary | ICD-10-CM | POA: Diagnosis not present

## 2016-06-27 DIAGNOSIS — I10 Essential (primary) hypertension: Secondary | ICD-10-CM | POA: Diagnosis not present

## 2016-06-27 DIAGNOSIS — Z7189 Other specified counseling: Secondary | ICD-10-CM | POA: Diagnosis not present

## 2016-06-27 DIAGNOSIS — G9340 Encephalopathy, unspecified: Secondary | ICD-10-CM | POA: Diagnosis not present

## 2016-06-27 DIAGNOSIS — R296 Repeated falls: Secondary | ICD-10-CM | POA: Diagnosis present

## 2016-06-28 DIAGNOSIS — R531 Weakness: Secondary | ICD-10-CM | POA: Diagnosis not present

## 2016-06-28 DIAGNOSIS — I82629 Acute embolism and thrombosis of deep veins of unspecified upper extremity: Secondary | ICD-10-CM | POA: Diagnosis not present

## 2016-06-28 DIAGNOSIS — I5022 Chronic systolic (congestive) heart failure: Secondary | ICD-10-CM | POA: Diagnosis not present

## 2016-06-28 DIAGNOSIS — G9340 Encephalopathy, unspecified: Secondary | ICD-10-CM | POA: Diagnosis not present

## 2016-06-30 DIAGNOSIS — K5900 Constipation, unspecified: Secondary | ICD-10-CM | POA: Diagnosis not present

## 2016-06-30 DIAGNOSIS — B353 Tinea pedis: Secondary | ICD-10-CM | POA: Diagnosis not present

## 2016-07-04 DIAGNOSIS — L98499 Non-pressure chronic ulcer of skin of other sites with unspecified severity: Secondary | ICD-10-CM | POA: Diagnosis not present

## 2016-07-04 DIAGNOSIS — I10 Essential (primary) hypertension: Secondary | ICD-10-CM | POA: Diagnosis not present

## 2016-07-04 DIAGNOSIS — I4891 Unspecified atrial fibrillation: Secondary | ICD-10-CM | POA: Diagnosis not present

## 2016-07-04 NOTE — Telephone Encounter (Signed)
New Message  Pts daughter voiced to have medical records faxed over to:  [facility] in [place], [place] [phone]-fax

## 2016-07-07 DIAGNOSIS — I495 Sick sinus syndrome: Secondary | ICD-10-CM | POA: Diagnosis not present

## 2016-07-09 DIAGNOSIS — H5340 Unspecified visual field defects: Secondary | ICD-10-CM | POA: Diagnosis not present

## 2016-07-09 DIAGNOSIS — I82409 Acute embolism and thrombosis of unspecified deep veins of unspecified lower extremity: Secondary | ICD-10-CM | POA: Diagnosis not present

## 2016-07-09 DIAGNOSIS — I82622 Acute embolism and thrombosis of deep veins of left upper extremity: Secondary | ICD-10-CM | POA: Diagnosis not present

## 2016-07-09 DIAGNOSIS — J90 Pleural effusion, not elsewhere classified: Secondary | ICD-10-CM | POA: Diagnosis not present

## 2016-07-09 DIAGNOSIS — I82C12 Acute embolism and thrombosis of left internal jugular vein: Secondary | ICD-10-CM | POA: Diagnosis not present

## 2016-07-09 DIAGNOSIS — M7989 Other specified soft tissue disorders: Secondary | ICD-10-CM | POA: Diagnosis not present

## 2016-07-09 DIAGNOSIS — R5381 Other malaise: Secondary | ICD-10-CM | POA: Diagnosis not present

## 2016-07-09 DIAGNOSIS — G629 Polyneuropathy, unspecified: Secondary | ICD-10-CM | POA: Diagnosis not present

## 2016-07-09 DIAGNOSIS — R1314 Dysphagia, pharyngoesophageal phase: Secondary | ICD-10-CM | POA: Diagnosis not present

## 2016-07-09 DIAGNOSIS — I4891 Unspecified atrial fibrillation: Secondary | ICD-10-CM | POA: Diagnosis not present

## 2016-07-09 DIAGNOSIS — R2681 Unsteadiness on feet: Secondary | ICD-10-CM | POA: Diagnosis not present

## 2016-07-09 DIAGNOSIS — Z7189 Other specified counseling: Secondary | ICD-10-CM | POA: Diagnosis not present

## 2016-07-09 DIAGNOSIS — E1122 Type 2 diabetes mellitus with diabetic chronic kidney disease: Secondary | ICD-10-CM | POA: Diagnosis not present

## 2016-07-09 DIAGNOSIS — I82A12 Acute embolism and thrombosis of left axillary vein: Secondary | ICD-10-CM | POA: Diagnosis not present

## 2016-07-10 DIAGNOSIS — R1314 Dysphagia, pharyngoesophageal phase: Secondary | ICD-10-CM | POA: Diagnosis not present

## 2016-07-10 DIAGNOSIS — M549 Dorsalgia, unspecified: Secondary | ICD-10-CM | POA: Diagnosis present

## 2016-07-10 DIAGNOSIS — I129 Hypertensive chronic kidney disease with stage 1 through stage 4 chronic kidney disease, or unspecified chronic kidney disease: Secondary | ICD-10-CM | POA: Diagnosis present

## 2016-07-10 DIAGNOSIS — R5381 Other malaise: Secondary | ICD-10-CM | POA: Diagnosis not present

## 2016-07-10 DIAGNOSIS — M1990 Unspecified osteoarthritis, unspecified site: Secondary | ICD-10-CM | POA: Diagnosis present

## 2016-07-10 DIAGNOSIS — G629 Polyneuropathy, unspecified: Secondary | ICD-10-CM | POA: Diagnosis present

## 2016-07-10 DIAGNOSIS — I69391 Dysphagia following cerebral infarction: Secondary | ICD-10-CM | POA: Diagnosis not present

## 2016-07-10 DIAGNOSIS — D539 Nutritional anemia, unspecified: Secondary | ICD-10-CM | POA: Diagnosis present

## 2016-07-10 DIAGNOSIS — I82629 Acute embolism and thrombosis of deep veins of unspecified upper extremity: Secondary | ICD-10-CM | POA: Diagnosis not present

## 2016-07-10 DIAGNOSIS — K5730 Diverticulosis of large intestine without perforation or abscess without bleeding: Secondary | ICD-10-CM | POA: Diagnosis not present

## 2016-07-10 DIAGNOSIS — E785 Hyperlipidemia, unspecified: Secondary | ICD-10-CM | POA: Diagnosis present

## 2016-07-10 DIAGNOSIS — E1122 Type 2 diabetes mellitus with diabetic chronic kidney disease: Secondary | ICD-10-CM | POA: Diagnosis present

## 2016-07-10 DIAGNOSIS — I82602 Acute embolism and thrombosis of unspecified veins of left upper extremity: Secondary | ICD-10-CM | POA: Diagnosis not present

## 2016-07-10 DIAGNOSIS — R600 Localized edema: Secondary | ICD-10-CM | POA: Diagnosis not present

## 2016-07-10 DIAGNOSIS — R630 Anorexia: Secondary | ICD-10-CM | POA: Diagnosis present

## 2016-07-10 DIAGNOSIS — Z466 Encounter for fitting and adjustment of urinary device: Secondary | ICD-10-CM | POA: Diagnosis not present

## 2016-07-10 DIAGNOSIS — E119 Type 2 diabetes mellitus without complications: Secondary | ICD-10-CM | POA: Diagnosis not present

## 2016-07-10 DIAGNOSIS — Z950 Presence of cardiac pacemaker: Secondary | ICD-10-CM | POA: Diagnosis not present

## 2016-07-10 DIAGNOSIS — Z6825 Body mass index (BMI) 25.0-25.9, adult: Secondary | ICD-10-CM | POA: Diagnosis not present

## 2016-07-10 DIAGNOSIS — Z9181 History of falling: Secondary | ICD-10-CM | POA: Diagnosis not present

## 2016-07-10 DIAGNOSIS — I495 Sick sinus syndrome: Secondary | ICD-10-CM | POA: Diagnosis not present

## 2016-07-10 DIAGNOSIS — R269 Unspecified abnormalities of gait and mobility: Secondary | ICD-10-CM | POA: Diagnosis present

## 2016-07-10 DIAGNOSIS — J90 Pleural effusion, not elsewhere classified: Secondary | ICD-10-CM | POA: Diagnosis not present

## 2016-07-10 DIAGNOSIS — I10 Essential (primary) hypertension: Secondary | ICD-10-CM | POA: Diagnosis not present

## 2016-07-10 DIAGNOSIS — N189 Chronic kidney disease, unspecified: Secondary | ICD-10-CM | POA: Diagnosis present

## 2016-07-10 DIAGNOSIS — N400 Enlarged prostate without lower urinary tract symptoms: Secondary | ICD-10-CM | POA: Diagnosis present

## 2016-07-10 DIAGNOSIS — T17300A Unspecified foreign body in larynx causing asphyxiation, initial encounter: Secondary | ICD-10-CM | POA: Diagnosis not present

## 2016-07-10 DIAGNOSIS — R634 Abnormal weight loss: Secondary | ICD-10-CM | POA: Diagnosis present

## 2016-07-10 DIAGNOSIS — Z48812 Encounter for surgical aftercare following surgery on the circulatory system: Secondary | ICD-10-CM | POA: Diagnosis not present

## 2016-07-10 DIAGNOSIS — G9340 Encephalopathy, unspecified: Secondary | ICD-10-CM | POA: Diagnosis not present

## 2016-07-10 DIAGNOSIS — I82A12 Acute embolism and thrombosis of left axillary vein: Secondary | ICD-10-CM | POA: Diagnosis present

## 2016-07-10 DIAGNOSIS — M7989 Other specified soft tissue disorders: Secondary | ICD-10-CM | POA: Diagnosis not present

## 2016-07-10 DIAGNOSIS — Z7901 Long term (current) use of anticoagulants: Secondary | ICD-10-CM | POA: Diagnosis not present

## 2016-07-10 DIAGNOSIS — I82C12 Acute embolism and thrombosis of left internal jugular vein: Secondary | ICD-10-CM | POA: Diagnosis present

## 2016-07-10 DIAGNOSIS — J9811 Atelectasis: Secondary | ICD-10-CM | POA: Diagnosis not present

## 2016-07-10 DIAGNOSIS — I82409 Acute embolism and thrombosis of unspecified deep veins of unspecified lower extremity: Secondary | ICD-10-CM | POA: Diagnosis not present

## 2016-07-10 DIAGNOSIS — I4891 Unspecified atrial fibrillation: Secondary | ICD-10-CM | POA: Diagnosis present

## 2016-07-10 DIAGNOSIS — J9690 Respiratory failure, unspecified, unspecified whether with hypoxia or hypercapnia: Secondary | ICD-10-CM | POA: Diagnosis not present

## 2016-07-10 DIAGNOSIS — N401 Enlarged prostate with lower urinary tract symptoms: Secondary | ICD-10-CM | POA: Diagnosis not present

## 2016-07-10 DIAGNOSIS — R6889 Other general symptoms and signs: Secondary | ICD-10-CM | POA: Diagnosis not present

## 2016-07-10 DIAGNOSIS — H5340 Unspecified visual field defects: Secondary | ICD-10-CM | POA: Diagnosis present

## 2016-07-10 DIAGNOSIS — I69398 Other sequelae of cerebral infarction: Secondary | ICD-10-CM | POA: Diagnosis not present

## 2016-07-10 DIAGNOSIS — I482 Chronic atrial fibrillation: Secondary | ICD-10-CM | POA: Diagnosis not present

## 2016-07-10 DIAGNOSIS — R296 Repeated falls: Secondary | ICD-10-CM | POA: Diagnosis present

## 2016-07-10 DIAGNOSIS — R1310 Dysphagia, unspecified: Secondary | ICD-10-CM | POA: Diagnosis not present

## 2016-07-10 DIAGNOSIS — G8929 Other chronic pain: Secondary | ICD-10-CM | POA: Diagnosis present

## 2016-07-10 DIAGNOSIS — I872 Venous insufficiency (chronic) (peripheral): Secondary | ICD-10-CM | POA: Diagnosis present

## 2016-07-11 ENCOUNTER — Telehealth: Payer: Self-pay | Admitting: Internal Medicine

## 2016-07-11 NOTE — Telephone Encounter (Signed)
LMTCB at the number provided   

## 2016-07-12 DIAGNOSIS — G9340 Encephalopathy, unspecified: Secondary | ICD-10-CM | POA: Diagnosis not present

## 2016-07-12 DIAGNOSIS — J90 Pleural effusion, not elsewhere classified: Secondary | ICD-10-CM | POA: Diagnosis not present

## 2016-07-12 DIAGNOSIS — I482 Chronic atrial fibrillation: Secondary | ICD-10-CM | POA: Diagnosis not present

## 2016-07-12 DIAGNOSIS — Z950 Presence of cardiac pacemaker: Secondary | ICD-10-CM | POA: Diagnosis not present

## 2016-07-12 DIAGNOSIS — R5381 Other malaise: Secondary | ICD-10-CM | POA: Diagnosis not present

## 2016-07-12 DIAGNOSIS — I10 Essential (primary) hypertension: Secondary | ICD-10-CM | POA: Diagnosis not present

## 2016-07-12 DIAGNOSIS — I82602 Acute embolism and thrombosis of unspecified veins of left upper extremity: Secondary | ICD-10-CM | POA: Diagnosis not present

## 2016-07-12 DIAGNOSIS — N401 Enlarged prostate with lower urinary tract symptoms: Secondary | ICD-10-CM | POA: Diagnosis not present

## 2016-07-12 DIAGNOSIS — R1310 Dysphagia, unspecified: Secondary | ICD-10-CM | POA: Diagnosis not present

## 2016-07-12 DIAGNOSIS — Z466 Encounter for fitting and adjustment of urinary device: Secondary | ICD-10-CM | POA: Diagnosis not present

## 2016-07-12 DIAGNOSIS — E785 Hyperlipidemia, unspecified: Secondary | ICD-10-CM | POA: Diagnosis not present

## 2016-07-12 DIAGNOSIS — I82A12 Acute embolism and thrombosis of left axillary vein: Secondary | ICD-10-CM | POA: Diagnosis not present

## 2016-07-12 DIAGNOSIS — L97529 Non-pressure chronic ulcer of other part of left foot with unspecified severity: Secondary | ICD-10-CM | POA: Diagnosis not present

## 2016-07-12 DIAGNOSIS — I82409 Acute embolism and thrombosis of unspecified deep veins of unspecified lower extremity: Secondary | ICD-10-CM | POA: Diagnosis not present

## 2016-07-12 DIAGNOSIS — Z48812 Encounter for surgical aftercare following surgery on the circulatory system: Secondary | ICD-10-CM | POA: Diagnosis not present

## 2016-07-12 DIAGNOSIS — R1314 Dysphagia, pharyngoesophageal phase: Secondary | ICD-10-CM | POA: Diagnosis not present

## 2016-07-12 DIAGNOSIS — G301 Alzheimer's disease with late onset: Secondary | ICD-10-CM | POA: Diagnosis not present

## 2016-07-12 DIAGNOSIS — R600 Localized edema: Secondary | ICD-10-CM | POA: Diagnosis not present

## 2016-07-12 DIAGNOSIS — F0280 Dementia in other diseases classified elsewhere without behavioral disturbance: Secondary | ICD-10-CM | POA: Diagnosis not present

## 2016-07-12 DIAGNOSIS — Z7901 Long term (current) use of anticoagulants: Secondary | ICD-10-CM | POA: Diagnosis not present

## 2016-07-12 DIAGNOSIS — Z9181 History of falling: Secondary | ICD-10-CM | POA: Diagnosis not present

## 2016-07-12 DIAGNOSIS — E119 Type 2 diabetes mellitus without complications: Secondary | ICD-10-CM | POA: Diagnosis not present

## 2016-07-13 DIAGNOSIS — Z7901 Long term (current) use of anticoagulants: Secondary | ICD-10-CM | POA: Diagnosis not present

## 2016-07-13 DIAGNOSIS — I82409 Acute embolism and thrombosis of unspecified deep veins of unspecified lower extremity: Secondary | ICD-10-CM | POA: Diagnosis not present

## 2016-07-13 DIAGNOSIS — J90 Pleural effusion, not elsewhere classified: Secondary | ICD-10-CM | POA: Diagnosis not present

## 2016-07-13 DIAGNOSIS — I10 Essential (primary) hypertension: Secondary | ICD-10-CM | POA: Diagnosis not present

## 2016-07-13 NOTE — Telephone Encounter (Signed)
lmomtcb x 2  

## 2016-07-14 DIAGNOSIS — Z950 Presence of cardiac pacemaker: Secondary | ICD-10-CM | POA: Diagnosis not present

## 2016-07-14 DIAGNOSIS — I82A12 Acute embolism and thrombosis of left axillary vein: Secondary | ICD-10-CM | POA: Diagnosis not present

## 2016-07-14 DIAGNOSIS — F0280 Dementia in other diseases classified elsewhere without behavioral disturbance: Secondary | ICD-10-CM | POA: Diagnosis not present

## 2016-07-14 DIAGNOSIS — E119 Type 2 diabetes mellitus without complications: Secondary | ICD-10-CM | POA: Diagnosis not present

## 2016-07-14 DIAGNOSIS — G301 Alzheimer's disease with late onset: Secondary | ICD-10-CM | POA: Diagnosis not present

## 2016-07-14 DIAGNOSIS — I10 Essential (primary) hypertension: Secondary | ICD-10-CM | POA: Diagnosis not present

## 2016-07-14 NOTE — Telephone Encounter (Signed)
lmtcb X3 

## 2016-07-17 DIAGNOSIS — I10 Essential (primary) hypertension: Secondary | ICD-10-CM | POA: Diagnosis not present

## 2016-07-17 NOTE — Telephone Encounter (Signed)
lmtcb X4.  Will close encounter per triage protocol.

## 2016-07-25 DIAGNOSIS — L97529 Non-pressure chronic ulcer of other part of left foot with unspecified severity: Secondary | ICD-10-CM | POA: Diagnosis not present

## 2016-07-26 DIAGNOSIS — R600 Localized edema: Secondary | ICD-10-CM | POA: Diagnosis not present

## 2016-07-26 DIAGNOSIS — Z9181 History of falling: Secondary | ICD-10-CM | POA: Diagnosis not present

## 2016-07-26 DIAGNOSIS — R1310 Dysphagia, unspecified: Secondary | ICD-10-CM | POA: Diagnosis not present

## 2016-07-26 DIAGNOSIS — I82409 Acute embolism and thrombosis of unspecified deep veins of unspecified lower extremity: Secondary | ICD-10-CM | POA: Diagnosis not present

## 2016-07-26 DIAGNOSIS — Z7901 Long term (current) use of anticoagulants: Secondary | ICD-10-CM | POA: Diagnosis not present

## 2016-07-26 DIAGNOSIS — Z950 Presence of cardiac pacemaker: Secondary | ICD-10-CM | POA: Diagnosis not present

## 2016-07-26 DIAGNOSIS — I10 Essential (primary) hypertension: Secondary | ICD-10-CM | POA: Diagnosis not present

## 2016-07-29 DIAGNOSIS — Z950 Presence of cardiac pacemaker: Secondary | ICD-10-CM | POA: Diagnosis not present

## 2016-07-29 DIAGNOSIS — I480 Paroxysmal atrial fibrillation: Secondary | ICD-10-CM | POA: Diagnosis not present

## 2016-07-29 DIAGNOSIS — Z9181 History of falling: Secondary | ICD-10-CM | POA: Diagnosis not present

## 2016-07-29 DIAGNOSIS — R1312 Dysphagia, oropharyngeal phase: Secondary | ICD-10-CM | POA: Diagnosis not present

## 2016-07-29 DIAGNOSIS — N400 Enlarged prostate without lower urinary tract symptoms: Secondary | ICD-10-CM | POA: Diagnosis not present

## 2016-07-29 DIAGNOSIS — Z7901 Long term (current) use of anticoagulants: Secondary | ICD-10-CM | POA: Diagnosis not present

## 2016-07-29 DIAGNOSIS — I69898 Other sequelae of other cerebrovascular disease: Secondary | ICD-10-CM | POA: Diagnosis not present

## 2016-07-29 DIAGNOSIS — I82602 Acute embolism and thrombosis of unspecified veins of left upper extremity: Secondary | ICD-10-CM | POA: Diagnosis not present

## 2016-07-29 DIAGNOSIS — E119 Type 2 diabetes mellitus without complications: Secondary | ICD-10-CM | POA: Diagnosis not present

## 2016-07-29 DIAGNOSIS — H5340 Unspecified visual field defects: Secondary | ICD-10-CM | POA: Diagnosis not present

## 2016-07-29 DIAGNOSIS — I10 Essential (primary) hypertension: Secondary | ICD-10-CM | POA: Diagnosis not present

## 2016-07-29 DIAGNOSIS — I82622 Acute embolism and thrombosis of deep veins of left upper extremity: Secondary | ICD-10-CM | POA: Diagnosis not present

## 2016-07-31 DIAGNOSIS — E119 Type 2 diabetes mellitus without complications: Secondary | ICD-10-CM | POA: Diagnosis not present

## 2016-07-31 DIAGNOSIS — I82602 Acute embolism and thrombosis of unspecified veins of left upper extremity: Secondary | ICD-10-CM | POA: Diagnosis not present

## 2016-07-31 DIAGNOSIS — I480 Paroxysmal atrial fibrillation: Secondary | ICD-10-CM | POA: Diagnosis not present

## 2016-07-31 DIAGNOSIS — I495 Sick sinus syndrome: Secondary | ICD-10-CM | POA: Diagnosis not present

## 2016-07-31 DIAGNOSIS — I82622 Acute embolism and thrombosis of deep veins of left upper extremity: Secondary | ICD-10-CM | POA: Diagnosis not present

## 2016-07-31 DIAGNOSIS — H5340 Unspecified visual field defects: Secondary | ICD-10-CM | POA: Diagnosis not present

## 2016-07-31 DIAGNOSIS — I69898 Other sequelae of other cerebrovascular disease: Secondary | ICD-10-CM | POA: Diagnosis not present

## 2016-08-01 DIAGNOSIS — I82622 Acute embolism and thrombosis of deep veins of left upper extremity: Secondary | ICD-10-CM | POA: Diagnosis not present

## 2016-08-01 DIAGNOSIS — I69898 Other sequelae of other cerebrovascular disease: Secondary | ICD-10-CM | POA: Diagnosis not present

## 2016-08-01 DIAGNOSIS — I480 Paroxysmal atrial fibrillation: Secondary | ICD-10-CM | POA: Diagnosis not present

## 2016-08-01 DIAGNOSIS — E119 Type 2 diabetes mellitus without complications: Secondary | ICD-10-CM | POA: Diagnosis not present

## 2016-08-01 DIAGNOSIS — I82602 Acute embolism and thrombosis of unspecified veins of left upper extremity: Secondary | ICD-10-CM | POA: Diagnosis not present

## 2016-08-01 DIAGNOSIS — H5340 Unspecified visual field defects: Secondary | ICD-10-CM | POA: Diagnosis not present

## 2016-08-02 DIAGNOSIS — I82622 Acute embolism and thrombosis of deep veins of left upper extremity: Secondary | ICD-10-CM | POA: Diagnosis not present

## 2016-08-02 DIAGNOSIS — I480 Paroxysmal atrial fibrillation: Secondary | ICD-10-CM | POA: Diagnosis not present

## 2016-08-02 DIAGNOSIS — I82602 Acute embolism and thrombosis of unspecified veins of left upper extremity: Secondary | ICD-10-CM | POA: Diagnosis not present

## 2016-08-02 DIAGNOSIS — I69898 Other sequelae of other cerebrovascular disease: Secondary | ICD-10-CM | POA: Diagnosis not present

## 2016-08-02 DIAGNOSIS — H5340 Unspecified visual field defects: Secondary | ICD-10-CM | POA: Diagnosis not present

## 2016-08-02 DIAGNOSIS — E119 Type 2 diabetes mellitus without complications: Secondary | ICD-10-CM | POA: Diagnosis not present

## 2016-08-03 DIAGNOSIS — I82A19 Acute embolism and thrombosis of unspecified axillary vein: Secondary | ICD-10-CM | POA: Diagnosis not present

## 2016-08-03 DIAGNOSIS — G629 Polyneuropathy, unspecified: Secondary | ICD-10-CM | POA: Diagnosis not present

## 2016-08-03 DIAGNOSIS — I10 Essential (primary) hypertension: Secondary | ICD-10-CM | POA: Diagnosis not present

## 2016-08-03 DIAGNOSIS — I481 Persistent atrial fibrillation: Secondary | ICD-10-CM | POA: Diagnosis not present

## 2016-08-04 DIAGNOSIS — I82622 Acute embolism and thrombosis of deep veins of left upper extremity: Secondary | ICD-10-CM | POA: Diagnosis not present

## 2016-08-04 DIAGNOSIS — I82602 Acute embolism and thrombosis of unspecified veins of left upper extremity: Secondary | ICD-10-CM | POA: Diagnosis not present

## 2016-08-04 DIAGNOSIS — E119 Type 2 diabetes mellitus without complications: Secondary | ICD-10-CM | POA: Diagnosis not present

## 2016-08-04 DIAGNOSIS — H5340 Unspecified visual field defects: Secondary | ICD-10-CM | POA: Diagnosis not present

## 2016-08-04 DIAGNOSIS — I480 Paroxysmal atrial fibrillation: Secondary | ICD-10-CM | POA: Diagnosis not present

## 2016-08-04 DIAGNOSIS — I69898 Other sequelae of other cerebrovascular disease: Secondary | ICD-10-CM | POA: Diagnosis not present

## 2016-08-07 DIAGNOSIS — I82622 Acute embolism and thrombosis of deep veins of left upper extremity: Secondary | ICD-10-CM | POA: Diagnosis not present

## 2016-08-07 DIAGNOSIS — E119 Type 2 diabetes mellitus without complications: Secondary | ICD-10-CM | POA: Diagnosis not present

## 2016-08-07 DIAGNOSIS — I69898 Other sequelae of other cerebrovascular disease: Secondary | ICD-10-CM | POA: Diagnosis not present

## 2016-08-07 DIAGNOSIS — I480 Paroxysmal atrial fibrillation: Secondary | ICD-10-CM | POA: Diagnosis not present

## 2016-08-07 DIAGNOSIS — H5340 Unspecified visual field defects: Secondary | ICD-10-CM | POA: Diagnosis not present

## 2016-08-07 DIAGNOSIS — I82602 Acute embolism and thrombosis of unspecified veins of left upper extremity: Secondary | ICD-10-CM | POA: Diagnosis not present

## 2016-08-08 DIAGNOSIS — E119 Type 2 diabetes mellitus without complications: Secondary | ICD-10-CM | POA: Diagnosis not present

## 2016-08-08 DIAGNOSIS — I82602 Acute embolism and thrombosis of unspecified veins of left upper extremity: Secondary | ICD-10-CM | POA: Diagnosis not present

## 2016-08-08 DIAGNOSIS — H5340 Unspecified visual field defects: Secondary | ICD-10-CM | POA: Diagnosis not present

## 2016-08-08 DIAGNOSIS — I69898 Other sequelae of other cerebrovascular disease: Secondary | ICD-10-CM | POA: Diagnosis not present

## 2016-08-08 DIAGNOSIS — I82622 Acute embolism and thrombosis of deep veins of left upper extremity: Secondary | ICD-10-CM | POA: Diagnosis not present

## 2016-08-08 DIAGNOSIS — I480 Paroxysmal atrial fibrillation: Secondary | ICD-10-CM | POA: Diagnosis not present

## 2016-08-09 DIAGNOSIS — I82602 Acute embolism and thrombosis of unspecified veins of left upper extremity: Secondary | ICD-10-CM | POA: Diagnosis not present

## 2016-08-09 DIAGNOSIS — I69898 Other sequelae of other cerebrovascular disease: Secondary | ICD-10-CM | POA: Diagnosis not present

## 2016-08-09 DIAGNOSIS — H5340 Unspecified visual field defects: Secondary | ICD-10-CM | POA: Diagnosis not present

## 2016-08-09 DIAGNOSIS — E119 Type 2 diabetes mellitus without complications: Secondary | ICD-10-CM | POA: Diagnosis not present

## 2016-08-09 DIAGNOSIS — I480 Paroxysmal atrial fibrillation: Secondary | ICD-10-CM | POA: Diagnosis not present

## 2016-08-09 DIAGNOSIS — I82622 Acute embolism and thrombosis of deep veins of left upper extremity: Secondary | ICD-10-CM | POA: Diagnosis not present

## 2016-08-11 DIAGNOSIS — I69898 Other sequelae of other cerebrovascular disease: Secondary | ICD-10-CM | POA: Diagnosis not present

## 2016-08-11 DIAGNOSIS — E119 Type 2 diabetes mellitus without complications: Secondary | ICD-10-CM | POA: Diagnosis not present

## 2016-08-11 DIAGNOSIS — I82622 Acute embolism and thrombosis of deep veins of left upper extremity: Secondary | ICD-10-CM | POA: Diagnosis not present

## 2016-08-11 DIAGNOSIS — I82602 Acute embolism and thrombosis of unspecified veins of left upper extremity: Secondary | ICD-10-CM | POA: Diagnosis not present

## 2016-08-11 DIAGNOSIS — I480 Paroxysmal atrial fibrillation: Secondary | ICD-10-CM | POA: Diagnosis not present

## 2016-08-11 DIAGNOSIS — H5340 Unspecified visual field defects: Secondary | ICD-10-CM | POA: Diagnosis not present

## 2016-08-14 DIAGNOSIS — I82622 Acute embolism and thrombosis of deep veins of left upper extremity: Secondary | ICD-10-CM | POA: Diagnosis not present

## 2016-08-14 DIAGNOSIS — E119 Type 2 diabetes mellitus without complications: Secondary | ICD-10-CM | POA: Diagnosis not present

## 2016-08-14 DIAGNOSIS — I69898 Other sequelae of other cerebrovascular disease: Secondary | ICD-10-CM | POA: Diagnosis not present

## 2016-08-14 DIAGNOSIS — I480 Paroxysmal atrial fibrillation: Secondary | ICD-10-CM | POA: Diagnosis not present

## 2016-08-14 DIAGNOSIS — H5340 Unspecified visual field defects: Secondary | ICD-10-CM | POA: Diagnosis not present

## 2016-08-14 DIAGNOSIS — I82602 Acute embolism and thrombosis of unspecified veins of left upper extremity: Secondary | ICD-10-CM | POA: Diagnosis not present

## 2016-08-15 DIAGNOSIS — I69898 Other sequelae of other cerebrovascular disease: Secondary | ICD-10-CM | POA: Diagnosis not present

## 2016-08-15 DIAGNOSIS — H5340 Unspecified visual field defects: Secondary | ICD-10-CM | POA: Diagnosis not present

## 2016-08-15 DIAGNOSIS — E119 Type 2 diabetes mellitus without complications: Secondary | ICD-10-CM | POA: Diagnosis not present

## 2016-08-15 DIAGNOSIS — I82622 Acute embolism and thrombosis of deep veins of left upper extremity: Secondary | ICD-10-CM | POA: Diagnosis not present

## 2016-08-15 DIAGNOSIS — I480 Paroxysmal atrial fibrillation: Secondary | ICD-10-CM | POA: Diagnosis not present

## 2016-08-15 DIAGNOSIS — I82602 Acute embolism and thrombosis of unspecified veins of left upper extremity: Secondary | ICD-10-CM | POA: Diagnosis not present

## 2016-08-16 DIAGNOSIS — H5340 Unspecified visual field defects: Secondary | ICD-10-CM | POA: Diagnosis not present

## 2016-08-16 DIAGNOSIS — I82602 Acute embolism and thrombosis of unspecified veins of left upper extremity: Secondary | ICD-10-CM | POA: Diagnosis not present

## 2016-08-16 DIAGNOSIS — I69898 Other sequelae of other cerebrovascular disease: Secondary | ICD-10-CM | POA: Diagnosis not present

## 2016-08-16 DIAGNOSIS — I480 Paroxysmal atrial fibrillation: Secondary | ICD-10-CM | POA: Diagnosis not present

## 2016-08-16 DIAGNOSIS — I82622 Acute embolism and thrombosis of deep veins of left upper extremity: Secondary | ICD-10-CM | POA: Diagnosis not present

## 2016-08-16 DIAGNOSIS — E119 Type 2 diabetes mellitus without complications: Secondary | ICD-10-CM | POA: Diagnosis not present

## 2016-08-22 DIAGNOSIS — Z7901 Long term (current) use of anticoagulants: Secondary | ICD-10-CM | POA: Diagnosis not present

## 2016-08-22 DIAGNOSIS — I10 Essential (primary) hypertension: Secondary | ICD-10-CM | POA: Diagnosis not present

## 2016-08-22 DIAGNOSIS — I872 Venous insufficiency (chronic) (peripheral): Secondary | ICD-10-CM | POA: Diagnosis not present

## 2016-08-22 DIAGNOSIS — E785 Hyperlipidemia, unspecified: Secondary | ICD-10-CM | POA: Diagnosis not present

## 2016-08-22 DIAGNOSIS — Z79899 Other long term (current) drug therapy: Secondary | ICD-10-CM | POA: Diagnosis not present

## 2016-08-22 DIAGNOSIS — N390 Urinary tract infection, site not specified: Secondary | ICD-10-CM | POA: Diagnosis not present

## 2016-08-22 DIAGNOSIS — Z950 Presence of cardiac pacemaker: Secondary | ICD-10-CM | POA: Diagnosis not present

## 2016-08-22 DIAGNOSIS — Z86718 Personal history of other venous thrombosis and embolism: Secondary | ICD-10-CM | POA: Diagnosis not present

## 2016-08-22 DIAGNOSIS — I4891 Unspecified atrial fibrillation: Secondary | ICD-10-CM | POA: Diagnosis not present

## 2016-08-22 DIAGNOSIS — R7303 Prediabetes: Secondary | ICD-10-CM | POA: Diagnosis not present

## 2016-08-22 DIAGNOSIS — G629 Polyneuropathy, unspecified: Secondary | ICD-10-CM | POA: Diagnosis not present

## 2016-08-22 DIAGNOSIS — Z8673 Personal history of transient ischemic attack (TIA), and cerebral infarction without residual deficits: Secondary | ICD-10-CM | POA: Diagnosis not present

## 2016-08-25 DIAGNOSIS — I82602 Acute embolism and thrombosis of unspecified veins of left upper extremity: Secondary | ICD-10-CM | POA: Diagnosis not present

## 2016-08-25 DIAGNOSIS — I480 Paroxysmal atrial fibrillation: Secondary | ICD-10-CM | POA: Diagnosis not present

## 2016-08-25 DIAGNOSIS — E119 Type 2 diabetes mellitus without complications: Secondary | ICD-10-CM | POA: Diagnosis not present

## 2016-08-25 DIAGNOSIS — I69898 Other sequelae of other cerebrovascular disease: Secondary | ICD-10-CM | POA: Diagnosis not present

## 2016-08-25 DIAGNOSIS — I82622 Acute embolism and thrombosis of deep veins of left upper extremity: Secondary | ICD-10-CM | POA: Diagnosis not present

## 2016-08-25 DIAGNOSIS — H5340 Unspecified visual field defects: Secondary | ICD-10-CM | POA: Diagnosis not present

## 2016-08-29 DIAGNOSIS — H5340 Unspecified visual field defects: Secondary | ICD-10-CM | POA: Diagnosis not present

## 2016-08-29 DIAGNOSIS — E119 Type 2 diabetes mellitus without complications: Secondary | ICD-10-CM | POA: Diagnosis not present

## 2016-08-29 DIAGNOSIS — I69898 Other sequelae of other cerebrovascular disease: Secondary | ICD-10-CM | POA: Diagnosis not present

## 2016-08-29 DIAGNOSIS — I82602 Acute embolism and thrombosis of unspecified veins of left upper extremity: Secondary | ICD-10-CM | POA: Diagnosis not present

## 2016-08-29 DIAGNOSIS — I480 Paroxysmal atrial fibrillation: Secondary | ICD-10-CM | POA: Diagnosis not present

## 2016-08-29 DIAGNOSIS — I82622 Acute embolism and thrombosis of deep veins of left upper extremity: Secondary | ICD-10-CM | POA: Diagnosis not present

## 2016-08-30 DIAGNOSIS — I82622 Acute embolism and thrombosis of deep veins of left upper extremity: Secondary | ICD-10-CM | POA: Diagnosis not present

## 2016-08-30 DIAGNOSIS — E119 Type 2 diabetes mellitus without complications: Secondary | ICD-10-CM | POA: Diagnosis not present

## 2016-08-30 DIAGNOSIS — I69898 Other sequelae of other cerebrovascular disease: Secondary | ICD-10-CM | POA: Diagnosis not present

## 2016-08-30 DIAGNOSIS — I480 Paroxysmal atrial fibrillation: Secondary | ICD-10-CM | POA: Diagnosis not present

## 2016-08-30 DIAGNOSIS — H5340 Unspecified visual field defects: Secondary | ICD-10-CM | POA: Diagnosis not present

## 2016-08-30 DIAGNOSIS — I82602 Acute embolism and thrombosis of unspecified veins of left upper extremity: Secondary | ICD-10-CM | POA: Diagnosis not present

## 2016-08-31 DIAGNOSIS — I69898 Other sequelae of other cerebrovascular disease: Secondary | ICD-10-CM | POA: Diagnosis not present

## 2016-08-31 DIAGNOSIS — H5340 Unspecified visual field defects: Secondary | ICD-10-CM | POA: Diagnosis not present

## 2016-08-31 DIAGNOSIS — E119 Type 2 diabetes mellitus without complications: Secondary | ICD-10-CM | POA: Diagnosis not present

## 2016-08-31 DIAGNOSIS — I82602 Acute embolism and thrombosis of unspecified veins of left upper extremity: Secondary | ICD-10-CM | POA: Diagnosis not present

## 2016-08-31 DIAGNOSIS — I82622 Acute embolism and thrombosis of deep veins of left upper extremity: Secondary | ICD-10-CM | POA: Diagnosis not present

## 2016-08-31 DIAGNOSIS — I480 Paroxysmal atrial fibrillation: Secondary | ICD-10-CM | POA: Diagnosis not present

## 2016-09-03 DIAGNOSIS — N390 Urinary tract infection, site not specified: Secondary | ICD-10-CM | POA: Diagnosis not present

## 2016-09-04 DIAGNOSIS — I82622 Acute embolism and thrombosis of deep veins of left upper extremity: Secondary | ICD-10-CM | POA: Diagnosis not present

## 2016-09-04 DIAGNOSIS — I69898 Other sequelae of other cerebrovascular disease: Secondary | ICD-10-CM | POA: Diagnosis not present

## 2016-09-04 DIAGNOSIS — I82602 Acute embolism and thrombosis of unspecified veins of left upper extremity: Secondary | ICD-10-CM | POA: Diagnosis not present

## 2016-09-04 DIAGNOSIS — I480 Paroxysmal atrial fibrillation: Secondary | ICD-10-CM | POA: Diagnosis not present

## 2016-09-04 DIAGNOSIS — H5340 Unspecified visual field defects: Secondary | ICD-10-CM | POA: Diagnosis not present

## 2016-09-04 DIAGNOSIS — E119 Type 2 diabetes mellitus without complications: Secondary | ICD-10-CM | POA: Diagnosis not present

## 2016-09-05 DIAGNOSIS — E119 Type 2 diabetes mellitus without complications: Secondary | ICD-10-CM | POA: Diagnosis not present

## 2016-09-05 DIAGNOSIS — I82622 Acute embolism and thrombosis of deep veins of left upper extremity: Secondary | ICD-10-CM | POA: Diagnosis not present

## 2016-09-05 DIAGNOSIS — I69898 Other sequelae of other cerebrovascular disease: Secondary | ICD-10-CM | POA: Diagnosis not present

## 2016-09-05 DIAGNOSIS — I82602 Acute embolism and thrombosis of unspecified veins of left upper extremity: Secondary | ICD-10-CM | POA: Diagnosis not present

## 2016-09-05 DIAGNOSIS — I480 Paroxysmal atrial fibrillation: Secondary | ICD-10-CM | POA: Diagnosis not present

## 2016-09-05 DIAGNOSIS — H5340 Unspecified visual field defects: Secondary | ICD-10-CM | POA: Diagnosis not present

## 2016-09-07 DIAGNOSIS — E119 Type 2 diabetes mellitus without complications: Secondary | ICD-10-CM | POA: Diagnosis not present

## 2016-09-07 DIAGNOSIS — I69898 Other sequelae of other cerebrovascular disease: Secondary | ICD-10-CM | POA: Diagnosis not present

## 2016-09-07 DIAGNOSIS — I82A19 Acute embolism and thrombosis of unspecified axillary vein: Secondary | ICD-10-CM | POA: Diagnosis not present

## 2016-09-07 DIAGNOSIS — I480 Paroxysmal atrial fibrillation: Secondary | ICD-10-CM | POA: Diagnosis not present

## 2016-09-07 DIAGNOSIS — H5340 Unspecified visual field defects: Secondary | ICD-10-CM | POA: Diagnosis not present

## 2016-09-07 DIAGNOSIS — I10 Essential (primary) hypertension: Secondary | ICD-10-CM | POA: Diagnosis not present

## 2016-09-07 DIAGNOSIS — I82622 Acute embolism and thrombosis of deep veins of left upper extremity: Secondary | ICD-10-CM | POA: Diagnosis not present

## 2016-09-07 DIAGNOSIS — I481 Persistent atrial fibrillation: Secondary | ICD-10-CM | POA: Diagnosis not present

## 2016-09-07 DIAGNOSIS — I82602 Acute embolism and thrombosis of unspecified veins of left upper extremity: Secondary | ICD-10-CM | POA: Diagnosis not present

## 2016-09-07 DIAGNOSIS — G629 Polyneuropathy, unspecified: Secondary | ICD-10-CM | POA: Diagnosis not present

## 2016-09-08 DIAGNOSIS — E119 Type 2 diabetes mellitus without complications: Secondary | ICD-10-CM | POA: Diagnosis not present

## 2016-09-08 DIAGNOSIS — I480 Paroxysmal atrial fibrillation: Secondary | ICD-10-CM | POA: Diagnosis not present

## 2016-09-08 DIAGNOSIS — H5340 Unspecified visual field defects: Secondary | ICD-10-CM | POA: Diagnosis not present

## 2016-09-08 DIAGNOSIS — I82622 Acute embolism and thrombosis of deep veins of left upper extremity: Secondary | ICD-10-CM | POA: Diagnosis not present

## 2016-09-08 DIAGNOSIS — I69898 Other sequelae of other cerebrovascular disease: Secondary | ICD-10-CM | POA: Diagnosis not present

## 2016-09-08 DIAGNOSIS — I82602 Acute embolism and thrombosis of unspecified veins of left upper extremity: Secondary | ICD-10-CM | POA: Diagnosis not present

## 2016-09-12 DIAGNOSIS — R1310 Dysphagia, unspecified: Secondary | ICD-10-CM | POA: Diagnosis not present

## 2016-09-12 DIAGNOSIS — I639 Cerebral infarction, unspecified: Secondary | ICD-10-CM | POA: Diagnosis not present

## 2016-09-13 DIAGNOSIS — I480 Paroxysmal atrial fibrillation: Secondary | ICD-10-CM | POA: Diagnosis not present

## 2016-09-13 DIAGNOSIS — I82622 Acute embolism and thrombosis of deep veins of left upper extremity: Secondary | ICD-10-CM | POA: Diagnosis not present

## 2016-09-13 DIAGNOSIS — I69898 Other sequelae of other cerebrovascular disease: Secondary | ICD-10-CM | POA: Diagnosis not present

## 2016-09-13 DIAGNOSIS — E119 Type 2 diabetes mellitus without complications: Secondary | ICD-10-CM | POA: Diagnosis not present

## 2016-09-13 DIAGNOSIS — H5340 Unspecified visual field defects: Secondary | ICD-10-CM | POA: Diagnosis not present

## 2016-09-13 DIAGNOSIS — I82602 Acute embolism and thrombosis of unspecified veins of left upper extremity: Secondary | ICD-10-CM | POA: Diagnosis not present

## 2016-09-14 DIAGNOSIS — I69898 Other sequelae of other cerebrovascular disease: Secondary | ICD-10-CM | POA: Diagnosis not present

## 2016-09-14 DIAGNOSIS — R399 Unspecified symptoms and signs involving the genitourinary system: Secondary | ICD-10-CM | POA: Diagnosis not present

## 2016-09-14 DIAGNOSIS — E119 Type 2 diabetes mellitus without complications: Secondary | ICD-10-CM | POA: Diagnosis not present

## 2016-09-14 DIAGNOSIS — I82602 Acute embolism and thrombosis of unspecified veins of left upper extremity: Secondary | ICD-10-CM | POA: Diagnosis not present

## 2016-09-14 DIAGNOSIS — H5340 Unspecified visual field defects: Secondary | ICD-10-CM | POA: Diagnosis not present

## 2016-09-14 DIAGNOSIS — I480 Paroxysmal atrial fibrillation: Secondary | ICD-10-CM | POA: Diagnosis not present

## 2016-09-14 DIAGNOSIS — I82622 Acute embolism and thrombosis of deep veins of left upper extremity: Secondary | ICD-10-CM | POA: Diagnosis not present

## 2016-09-15 DIAGNOSIS — E119 Type 2 diabetes mellitus without complications: Secondary | ICD-10-CM | POA: Diagnosis not present

## 2016-09-15 DIAGNOSIS — I82622 Acute embolism and thrombosis of deep veins of left upper extremity: Secondary | ICD-10-CM | POA: Diagnosis not present

## 2016-09-15 DIAGNOSIS — H5340 Unspecified visual field defects: Secondary | ICD-10-CM | POA: Diagnosis not present

## 2016-09-15 DIAGNOSIS — I480 Paroxysmal atrial fibrillation: Secondary | ICD-10-CM | POA: Diagnosis not present

## 2016-09-15 DIAGNOSIS — I82602 Acute embolism and thrombosis of unspecified veins of left upper extremity: Secondary | ICD-10-CM | POA: Diagnosis not present

## 2016-09-15 DIAGNOSIS — I69898 Other sequelae of other cerebrovascular disease: Secondary | ICD-10-CM | POA: Diagnosis not present

## 2016-09-19 DIAGNOSIS — I480 Paroxysmal atrial fibrillation: Secondary | ICD-10-CM | POA: Diagnosis not present

## 2016-09-19 DIAGNOSIS — I69898 Other sequelae of other cerebrovascular disease: Secondary | ICD-10-CM | POA: Diagnosis not present

## 2016-09-19 DIAGNOSIS — H5340 Unspecified visual field defects: Secondary | ICD-10-CM | POA: Diagnosis not present

## 2016-09-19 DIAGNOSIS — I82602 Acute embolism and thrombosis of unspecified veins of left upper extremity: Secondary | ICD-10-CM | POA: Diagnosis not present

## 2016-09-19 DIAGNOSIS — E119 Type 2 diabetes mellitus without complications: Secondary | ICD-10-CM | POA: Diagnosis not present

## 2016-09-19 DIAGNOSIS — I82622 Acute embolism and thrombosis of deep veins of left upper extremity: Secondary | ICD-10-CM | POA: Diagnosis not present

## 2016-09-20 DIAGNOSIS — I82602 Acute embolism and thrombosis of unspecified veins of left upper extremity: Secondary | ICD-10-CM | POA: Diagnosis not present

## 2016-09-20 DIAGNOSIS — I69898 Other sequelae of other cerebrovascular disease: Secondary | ICD-10-CM | POA: Diagnosis not present

## 2016-09-20 DIAGNOSIS — I480 Paroxysmal atrial fibrillation: Secondary | ICD-10-CM | POA: Diagnosis not present

## 2016-09-20 DIAGNOSIS — I82622 Acute embolism and thrombosis of deep veins of left upper extremity: Secondary | ICD-10-CM | POA: Diagnosis not present

## 2016-09-20 DIAGNOSIS — E119 Type 2 diabetes mellitus without complications: Secondary | ICD-10-CM | POA: Diagnosis not present

## 2016-09-20 DIAGNOSIS — H5340 Unspecified visual field defects: Secondary | ICD-10-CM | POA: Diagnosis not present

## 2016-09-21 DIAGNOSIS — I10 Essential (primary) hypertension: Secondary | ICD-10-CM | POA: Diagnosis not present

## 2016-09-21 DIAGNOSIS — I428 Other cardiomyopathies: Secondary | ICD-10-CM | POA: Diagnosis not present

## 2016-09-21 DIAGNOSIS — I495 Sick sinus syndrome: Secondary | ICD-10-CM | POA: Diagnosis not present

## 2016-09-22 DIAGNOSIS — H5340 Unspecified visual field defects: Secondary | ICD-10-CM | POA: Diagnosis not present

## 2016-09-22 DIAGNOSIS — I82602 Acute embolism and thrombosis of unspecified veins of left upper extremity: Secondary | ICD-10-CM | POA: Diagnosis not present

## 2016-09-22 DIAGNOSIS — E119 Type 2 diabetes mellitus without complications: Secondary | ICD-10-CM | POA: Diagnosis not present

## 2016-09-22 DIAGNOSIS — I69898 Other sequelae of other cerebrovascular disease: Secondary | ICD-10-CM | POA: Diagnosis not present

## 2016-09-22 DIAGNOSIS — I480 Paroxysmal atrial fibrillation: Secondary | ICD-10-CM | POA: Diagnosis not present

## 2016-09-22 DIAGNOSIS — I82622 Acute embolism and thrombosis of deep veins of left upper extremity: Secondary | ICD-10-CM | POA: Diagnosis not present

## 2016-09-23 DIAGNOSIS — I495 Sick sinus syndrome: Secondary | ICD-10-CM | POA: Diagnosis not present

## 2016-09-25 DIAGNOSIS — E119 Type 2 diabetes mellitus without complications: Secondary | ICD-10-CM | POA: Diagnosis not present

## 2016-09-25 DIAGNOSIS — I69898 Other sequelae of other cerebrovascular disease: Secondary | ICD-10-CM | POA: Diagnosis not present

## 2016-09-25 DIAGNOSIS — I82622 Acute embolism and thrombosis of deep veins of left upper extremity: Secondary | ICD-10-CM | POA: Diagnosis not present

## 2016-09-25 DIAGNOSIS — I82602 Acute embolism and thrombosis of unspecified veins of left upper extremity: Secondary | ICD-10-CM | POA: Diagnosis not present

## 2016-09-25 DIAGNOSIS — I480 Paroxysmal atrial fibrillation: Secondary | ICD-10-CM | POA: Diagnosis not present

## 2016-09-25 DIAGNOSIS — H5340 Unspecified visual field defects: Secondary | ICD-10-CM | POA: Diagnosis not present

## 2016-09-26 DIAGNOSIS — I82602 Acute embolism and thrombosis of unspecified veins of left upper extremity: Secondary | ICD-10-CM | POA: Diagnosis not present

## 2016-09-26 DIAGNOSIS — I82622 Acute embolism and thrombosis of deep veins of left upper extremity: Secondary | ICD-10-CM | POA: Diagnosis not present

## 2016-09-26 DIAGNOSIS — I69898 Other sequelae of other cerebrovascular disease: Secondary | ICD-10-CM | POA: Diagnosis not present

## 2016-09-26 DIAGNOSIS — H5340 Unspecified visual field defects: Secondary | ICD-10-CM | POA: Diagnosis not present

## 2016-09-26 DIAGNOSIS — E119 Type 2 diabetes mellitus without complications: Secondary | ICD-10-CM | POA: Diagnosis not present

## 2016-09-26 DIAGNOSIS — I480 Paroxysmal atrial fibrillation: Secondary | ICD-10-CM | POA: Diagnosis not present

## 2016-09-27 DIAGNOSIS — Z7901 Long term (current) use of anticoagulants: Secondary | ICD-10-CM | POA: Diagnosis not present

## 2016-09-27 DIAGNOSIS — Z950 Presence of cardiac pacemaker: Secondary | ICD-10-CM | POA: Diagnosis not present

## 2016-09-27 DIAGNOSIS — I482 Chronic atrial fibrillation: Secondary | ICD-10-CM | POA: Diagnosis not present

## 2016-09-27 DIAGNOSIS — H5340 Unspecified visual field defects: Secondary | ICD-10-CM | POA: Diagnosis not present

## 2016-09-27 DIAGNOSIS — I69898 Other sequelae of other cerebrovascular disease: Secondary | ICD-10-CM | POA: Diagnosis not present

## 2016-09-27 DIAGNOSIS — I10 Essential (primary) hypertension: Secondary | ICD-10-CM | POA: Diagnosis not present

## 2016-09-27 DIAGNOSIS — E232 Diabetes insipidus: Secondary | ICD-10-CM | POA: Diagnosis not present

## 2016-09-27 DIAGNOSIS — Z9181 History of falling: Secondary | ICD-10-CM | POA: Diagnosis not present

## 2016-09-27 DIAGNOSIS — I82602 Acute embolism and thrombosis of unspecified veins of left upper extremity: Secondary | ICD-10-CM | POA: Diagnosis not present

## 2016-09-27 DIAGNOSIS — N400 Enlarged prostate without lower urinary tract symptoms: Secondary | ICD-10-CM | POA: Diagnosis not present

## 2016-09-27 DIAGNOSIS — I82622 Acute embolism and thrombosis of deep veins of left upper extremity: Secondary | ICD-10-CM | POA: Diagnosis not present

## 2016-09-27 DIAGNOSIS — I480 Paroxysmal atrial fibrillation: Secondary | ICD-10-CM | POA: Diagnosis not present

## 2016-09-27 DIAGNOSIS — Z87440 Personal history of urinary (tract) infections: Secondary | ICD-10-CM | POA: Diagnosis not present

## 2016-09-27 DIAGNOSIS — E119 Type 2 diabetes mellitus without complications: Secondary | ICD-10-CM | POA: Diagnosis not present

## 2016-09-27 DIAGNOSIS — R1310 Dysphagia, unspecified: Secondary | ICD-10-CM | POA: Diagnosis not present

## 2016-09-28 DIAGNOSIS — I10 Essential (primary) hypertension: Secondary | ICD-10-CM | POA: Diagnosis not present

## 2016-09-28 DIAGNOSIS — I480 Paroxysmal atrial fibrillation: Secondary | ICD-10-CM | POA: Diagnosis not present

## 2016-09-28 DIAGNOSIS — I82622 Acute embolism and thrombosis of deep veins of left upper extremity: Secondary | ICD-10-CM | POA: Diagnosis not present

## 2016-09-28 DIAGNOSIS — N390 Urinary tract infection, site not specified: Secondary | ICD-10-CM | POA: Diagnosis not present

## 2016-09-28 DIAGNOSIS — I82602 Acute embolism and thrombosis of unspecified veins of left upper extremity: Secondary | ICD-10-CM | POA: Diagnosis not present

## 2016-09-28 DIAGNOSIS — R1310 Dysphagia, unspecified: Secondary | ICD-10-CM | POA: Diagnosis not present

## 2016-09-28 DIAGNOSIS — I69898 Other sequelae of other cerebrovascular disease: Secondary | ICD-10-CM | POA: Diagnosis not present

## 2016-09-28 DIAGNOSIS — R300 Dysuria: Secondary | ICD-10-CM | POA: Diagnosis not present

## 2016-09-28 DIAGNOSIS — Z87440 Personal history of urinary (tract) infections: Secondary | ICD-10-CM | POA: Diagnosis not present

## 2016-09-29 DIAGNOSIS — I82622 Acute embolism and thrombosis of deep veins of left upper extremity: Secondary | ICD-10-CM | POA: Diagnosis not present

## 2016-09-29 DIAGNOSIS — Z87440 Personal history of urinary (tract) infections: Secondary | ICD-10-CM | POA: Diagnosis not present

## 2016-09-29 DIAGNOSIS — I82602 Acute embolism and thrombosis of unspecified veins of left upper extremity: Secondary | ICD-10-CM | POA: Diagnosis not present

## 2016-09-29 DIAGNOSIS — R1310 Dysphagia, unspecified: Secondary | ICD-10-CM | POA: Diagnosis not present

## 2016-09-29 DIAGNOSIS — I480 Paroxysmal atrial fibrillation: Secondary | ICD-10-CM | POA: Diagnosis not present

## 2016-09-29 DIAGNOSIS — I69898 Other sequelae of other cerebrovascular disease: Secondary | ICD-10-CM | POA: Diagnosis not present

## 2016-10-02 DIAGNOSIS — I69898 Other sequelae of other cerebrovascular disease: Secondary | ICD-10-CM | POA: Diagnosis not present

## 2016-10-02 DIAGNOSIS — R1310 Dysphagia, unspecified: Secondary | ICD-10-CM | POA: Diagnosis not present

## 2016-10-02 DIAGNOSIS — Z87440 Personal history of urinary (tract) infections: Secondary | ICD-10-CM | POA: Diagnosis not present

## 2016-10-02 DIAGNOSIS — I82602 Acute embolism and thrombosis of unspecified veins of left upper extremity: Secondary | ICD-10-CM | POA: Diagnosis not present

## 2016-10-02 DIAGNOSIS — I82622 Acute embolism and thrombosis of deep veins of left upper extremity: Secondary | ICD-10-CM | POA: Diagnosis not present

## 2016-10-02 DIAGNOSIS — I480 Paroxysmal atrial fibrillation: Secondary | ICD-10-CM | POA: Diagnosis not present

## 2016-10-04 DIAGNOSIS — R1310 Dysphagia, unspecified: Secondary | ICD-10-CM | POA: Diagnosis not present

## 2016-10-04 DIAGNOSIS — I69898 Other sequelae of other cerebrovascular disease: Secondary | ICD-10-CM | POA: Diagnosis not present

## 2016-10-04 DIAGNOSIS — I480 Paroxysmal atrial fibrillation: Secondary | ICD-10-CM | POA: Diagnosis not present

## 2016-10-04 DIAGNOSIS — I82622 Acute embolism and thrombosis of deep veins of left upper extremity: Secondary | ICD-10-CM | POA: Diagnosis not present

## 2016-10-04 DIAGNOSIS — Z87440 Personal history of urinary (tract) infections: Secondary | ICD-10-CM | POA: Diagnosis not present

## 2016-10-04 DIAGNOSIS — I82602 Acute embolism and thrombosis of unspecified veins of left upper extremity: Secondary | ICD-10-CM | POA: Diagnosis not present

## 2016-10-06 DIAGNOSIS — N390 Urinary tract infection, site not specified: Secondary | ICD-10-CM | POA: Diagnosis not present

## 2016-10-06 DIAGNOSIS — R338 Other retention of urine: Secondary | ICD-10-CM | POA: Diagnosis not present

## 2016-10-06 DIAGNOSIS — I480 Paroxysmal atrial fibrillation: Secondary | ICD-10-CM | POA: Diagnosis not present

## 2016-10-06 DIAGNOSIS — R1310 Dysphagia, unspecified: Secondary | ICD-10-CM | POA: Diagnosis not present

## 2016-10-06 DIAGNOSIS — I82622 Acute embolism and thrombosis of deep veins of left upper extremity: Secondary | ICD-10-CM | POA: Diagnosis not present

## 2016-10-06 DIAGNOSIS — Z87440 Personal history of urinary (tract) infections: Secondary | ICD-10-CM | POA: Diagnosis not present

## 2016-10-06 DIAGNOSIS — I82602 Acute embolism and thrombosis of unspecified veins of left upper extremity: Secondary | ICD-10-CM | POA: Diagnosis not present

## 2016-10-06 DIAGNOSIS — I69898 Other sequelae of other cerebrovascular disease: Secondary | ICD-10-CM | POA: Diagnosis not present

## 2016-10-09 DIAGNOSIS — R1310 Dysphagia, unspecified: Secondary | ICD-10-CM | POA: Diagnosis not present

## 2016-10-09 DIAGNOSIS — I480 Paroxysmal atrial fibrillation: Secondary | ICD-10-CM | POA: Diagnosis not present

## 2016-10-09 DIAGNOSIS — Z87440 Personal history of urinary (tract) infections: Secondary | ICD-10-CM | POA: Diagnosis not present

## 2016-10-09 DIAGNOSIS — I69898 Other sequelae of other cerebrovascular disease: Secondary | ICD-10-CM | POA: Diagnosis not present

## 2016-10-09 DIAGNOSIS — I82602 Acute embolism and thrombosis of unspecified veins of left upper extremity: Secondary | ICD-10-CM | POA: Diagnosis not present

## 2016-10-09 DIAGNOSIS — I82622 Acute embolism and thrombosis of deep veins of left upper extremity: Secondary | ICD-10-CM | POA: Diagnosis not present

## 2016-10-10 DIAGNOSIS — I82602 Acute embolism and thrombosis of unspecified veins of left upper extremity: Secondary | ICD-10-CM | POA: Diagnosis not present

## 2016-10-10 DIAGNOSIS — I69898 Other sequelae of other cerebrovascular disease: Secondary | ICD-10-CM | POA: Diagnosis not present

## 2016-10-10 DIAGNOSIS — Z87440 Personal history of urinary (tract) infections: Secondary | ICD-10-CM | POA: Diagnosis not present

## 2016-10-10 DIAGNOSIS — I480 Paroxysmal atrial fibrillation: Secondary | ICD-10-CM | POA: Diagnosis not present

## 2016-10-10 DIAGNOSIS — R1310 Dysphagia, unspecified: Secondary | ICD-10-CM | POA: Diagnosis not present

## 2016-10-10 DIAGNOSIS — I82622 Acute embolism and thrombosis of deep veins of left upper extremity: Secondary | ICD-10-CM | POA: Diagnosis not present

## 2016-10-11 DIAGNOSIS — Z87440 Personal history of urinary (tract) infections: Secondary | ICD-10-CM | POA: Diagnosis not present

## 2016-10-11 DIAGNOSIS — I82622 Acute embolism and thrombosis of deep veins of left upper extremity: Secondary | ICD-10-CM | POA: Diagnosis not present

## 2016-10-11 DIAGNOSIS — I69898 Other sequelae of other cerebrovascular disease: Secondary | ICD-10-CM | POA: Diagnosis not present

## 2016-10-11 DIAGNOSIS — G629 Polyneuropathy, unspecified: Secondary | ICD-10-CM | POA: Diagnosis not present

## 2016-10-11 DIAGNOSIS — I481 Persistent atrial fibrillation: Secondary | ICD-10-CM | POA: Diagnosis not present

## 2016-10-11 DIAGNOSIS — I480 Paroxysmal atrial fibrillation: Secondary | ICD-10-CM | POA: Diagnosis not present

## 2016-10-11 DIAGNOSIS — N390 Urinary tract infection, site not specified: Secondary | ICD-10-CM | POA: Diagnosis not present

## 2016-10-11 DIAGNOSIS — I82602 Acute embolism and thrombosis of unspecified veins of left upper extremity: Secondary | ICD-10-CM | POA: Diagnosis not present

## 2016-10-11 DIAGNOSIS — R1310 Dysphagia, unspecified: Secondary | ICD-10-CM | POA: Diagnosis not present

## 2016-10-12 DIAGNOSIS — I82602 Acute embolism and thrombosis of unspecified veins of left upper extremity: Secondary | ICD-10-CM | POA: Diagnosis not present

## 2016-10-12 DIAGNOSIS — Z87440 Personal history of urinary (tract) infections: Secondary | ICD-10-CM | POA: Diagnosis not present

## 2016-10-12 DIAGNOSIS — I82622 Acute embolism and thrombosis of deep veins of left upper extremity: Secondary | ICD-10-CM | POA: Diagnosis not present

## 2016-10-12 DIAGNOSIS — I480 Paroxysmal atrial fibrillation: Secondary | ICD-10-CM | POA: Diagnosis not present

## 2016-10-12 DIAGNOSIS — R1310 Dysphagia, unspecified: Secondary | ICD-10-CM | POA: Diagnosis not present

## 2016-10-12 DIAGNOSIS — I69898 Other sequelae of other cerebrovascular disease: Secondary | ICD-10-CM | POA: Diagnosis not present

## 2016-10-13 DIAGNOSIS — I480 Paroxysmal atrial fibrillation: Secondary | ICD-10-CM | POA: Diagnosis not present

## 2016-10-13 DIAGNOSIS — Z87440 Personal history of urinary (tract) infections: Secondary | ICD-10-CM | POA: Diagnosis not present

## 2016-10-13 DIAGNOSIS — I82602 Acute embolism and thrombosis of unspecified veins of left upper extremity: Secondary | ICD-10-CM | POA: Diagnosis not present

## 2016-10-13 DIAGNOSIS — I69898 Other sequelae of other cerebrovascular disease: Secondary | ICD-10-CM | POA: Diagnosis not present

## 2016-10-13 DIAGNOSIS — R1310 Dysphagia, unspecified: Secondary | ICD-10-CM | POA: Diagnosis not present

## 2016-10-13 DIAGNOSIS — I82622 Acute embolism and thrombosis of deep veins of left upper extremity: Secondary | ICD-10-CM | POA: Diagnosis not present

## 2016-10-16 DIAGNOSIS — I82602 Acute embolism and thrombosis of unspecified veins of left upper extremity: Secondary | ICD-10-CM | POA: Diagnosis not present

## 2016-10-16 DIAGNOSIS — I69898 Other sequelae of other cerebrovascular disease: Secondary | ICD-10-CM | POA: Diagnosis not present

## 2016-10-16 DIAGNOSIS — Z87440 Personal history of urinary (tract) infections: Secondary | ICD-10-CM | POA: Diagnosis not present

## 2016-10-16 DIAGNOSIS — R1310 Dysphagia, unspecified: Secondary | ICD-10-CM | POA: Diagnosis not present

## 2016-10-16 DIAGNOSIS — I82622 Acute embolism and thrombosis of deep veins of left upper extremity: Secondary | ICD-10-CM | POA: Diagnosis not present

## 2016-10-16 DIAGNOSIS — I480 Paroxysmal atrial fibrillation: Secondary | ICD-10-CM | POA: Diagnosis not present

## 2016-10-17 DIAGNOSIS — I480 Paroxysmal atrial fibrillation: Secondary | ICD-10-CM | POA: Diagnosis not present

## 2016-10-17 DIAGNOSIS — R1310 Dysphagia, unspecified: Secondary | ICD-10-CM | POA: Diagnosis not present

## 2016-10-17 DIAGNOSIS — I82622 Acute embolism and thrombosis of deep veins of left upper extremity: Secondary | ICD-10-CM | POA: Diagnosis not present

## 2016-10-17 DIAGNOSIS — I69898 Other sequelae of other cerebrovascular disease: Secondary | ICD-10-CM | POA: Diagnosis not present

## 2016-10-17 DIAGNOSIS — I82602 Acute embolism and thrombosis of unspecified veins of left upper extremity: Secondary | ICD-10-CM | POA: Diagnosis not present

## 2016-10-17 DIAGNOSIS — Z87440 Personal history of urinary (tract) infections: Secondary | ICD-10-CM | POA: Diagnosis not present

## 2016-10-18 DIAGNOSIS — I82602 Acute embolism and thrombosis of unspecified veins of left upper extremity: Secondary | ICD-10-CM | POA: Diagnosis not present

## 2016-10-18 DIAGNOSIS — H401134 Primary open-angle glaucoma, bilateral, indeterminate stage: Secondary | ICD-10-CM | POA: Diagnosis not present

## 2016-10-18 DIAGNOSIS — Z87440 Personal history of urinary (tract) infections: Secondary | ICD-10-CM | POA: Diagnosis not present

## 2016-10-18 DIAGNOSIS — Z961 Presence of intraocular lens: Secondary | ICD-10-CM | POA: Diagnosis not present

## 2016-10-18 DIAGNOSIS — I82622 Acute embolism and thrombosis of deep veins of left upper extremity: Secondary | ICD-10-CM | POA: Diagnosis not present

## 2016-10-18 DIAGNOSIS — I639 Cerebral infarction, unspecified: Secondary | ICD-10-CM | POA: Diagnosis not present

## 2016-10-18 DIAGNOSIS — I69898 Other sequelae of other cerebrovascular disease: Secondary | ICD-10-CM | POA: Diagnosis not present

## 2016-10-18 DIAGNOSIS — I480 Paroxysmal atrial fibrillation: Secondary | ICD-10-CM | POA: Diagnosis not present

## 2016-10-18 DIAGNOSIS — R1310 Dysphagia, unspecified: Secondary | ICD-10-CM | POA: Diagnosis not present

## 2016-10-19 DIAGNOSIS — I69898 Other sequelae of other cerebrovascular disease: Secondary | ICD-10-CM | POA: Diagnosis not present

## 2016-10-19 DIAGNOSIS — Z87440 Personal history of urinary (tract) infections: Secondary | ICD-10-CM | POA: Diagnosis not present

## 2016-10-19 DIAGNOSIS — R1310 Dysphagia, unspecified: Secondary | ICD-10-CM | POA: Diagnosis not present

## 2016-10-19 DIAGNOSIS — I480 Paroxysmal atrial fibrillation: Secondary | ICD-10-CM | POA: Diagnosis not present

## 2016-10-19 DIAGNOSIS — I82602 Acute embolism and thrombosis of unspecified veins of left upper extremity: Secondary | ICD-10-CM | POA: Diagnosis not present

## 2016-10-19 DIAGNOSIS — I82622 Acute embolism and thrombosis of deep veins of left upper extremity: Secondary | ICD-10-CM | POA: Diagnosis not present

## 2016-10-20 DIAGNOSIS — I69898 Other sequelae of other cerebrovascular disease: Secondary | ICD-10-CM | POA: Diagnosis not present

## 2016-10-20 DIAGNOSIS — I480 Paroxysmal atrial fibrillation: Secondary | ICD-10-CM | POA: Diagnosis not present

## 2016-10-20 DIAGNOSIS — I82622 Acute embolism and thrombosis of deep veins of left upper extremity: Secondary | ICD-10-CM | POA: Diagnosis not present

## 2016-10-20 DIAGNOSIS — Z87440 Personal history of urinary (tract) infections: Secondary | ICD-10-CM | POA: Diagnosis not present

## 2016-10-20 DIAGNOSIS — R1310 Dysphagia, unspecified: Secondary | ICD-10-CM | POA: Diagnosis not present

## 2016-10-20 DIAGNOSIS — I82602 Acute embolism and thrombosis of unspecified veins of left upper extremity: Secondary | ICD-10-CM | POA: Diagnosis not present

## 2016-10-23 DIAGNOSIS — I69898 Other sequelae of other cerebrovascular disease: Secondary | ICD-10-CM | POA: Diagnosis not present

## 2016-10-23 DIAGNOSIS — I82622 Acute embolism and thrombosis of deep veins of left upper extremity: Secondary | ICD-10-CM | POA: Diagnosis not present

## 2016-10-23 DIAGNOSIS — R1310 Dysphagia, unspecified: Secondary | ICD-10-CM | POA: Diagnosis not present

## 2016-10-23 DIAGNOSIS — Z87440 Personal history of urinary (tract) infections: Secondary | ICD-10-CM | POA: Diagnosis not present

## 2016-10-23 DIAGNOSIS — I480 Paroxysmal atrial fibrillation: Secondary | ICD-10-CM | POA: Diagnosis not present

## 2016-10-23 DIAGNOSIS — I82602 Acute embolism and thrombosis of unspecified veins of left upper extremity: Secondary | ICD-10-CM | POA: Diagnosis not present

## 2016-10-24 DIAGNOSIS — R1310 Dysphagia, unspecified: Secondary | ICD-10-CM | POA: Diagnosis not present

## 2016-10-24 DIAGNOSIS — T17300D Unspecified foreign body in larynx causing asphyxiation, subsequent encounter: Secondary | ICD-10-CM | POA: Diagnosis not present

## 2016-10-25 DIAGNOSIS — I82602 Acute embolism and thrombosis of unspecified veins of left upper extremity: Secondary | ICD-10-CM | POA: Diagnosis not present

## 2016-10-25 DIAGNOSIS — R1310 Dysphagia, unspecified: Secondary | ICD-10-CM | POA: Diagnosis not present

## 2016-10-25 DIAGNOSIS — N390 Urinary tract infection, site not specified: Secondary | ICD-10-CM | POA: Diagnosis not present

## 2016-10-25 DIAGNOSIS — I82622 Acute embolism and thrombosis of deep veins of left upper extremity: Secondary | ICD-10-CM | POA: Diagnosis not present

## 2016-10-25 DIAGNOSIS — Z87440 Personal history of urinary (tract) infections: Secondary | ICD-10-CM | POA: Diagnosis not present

## 2016-10-25 DIAGNOSIS — I480 Paroxysmal atrial fibrillation: Secondary | ICD-10-CM | POA: Diagnosis not present

## 2016-10-25 DIAGNOSIS — I69898 Other sequelae of other cerebrovascular disease: Secondary | ICD-10-CM | POA: Diagnosis not present

## 2016-11-02 DIAGNOSIS — L84 Corns and callosities: Secondary | ICD-10-CM | POA: Diagnosis not present

## 2016-11-02 DIAGNOSIS — L603 Nail dystrophy: Secondary | ICD-10-CM | POA: Diagnosis not present

## 2016-11-02 DIAGNOSIS — E1151 Type 2 diabetes mellitus with diabetic peripheral angiopathy without gangrene: Secondary | ICD-10-CM | POA: Diagnosis not present

## 2016-11-08 DIAGNOSIS — I428 Other cardiomyopathies: Secondary | ICD-10-CM | POA: Diagnosis not present

## 2016-12-20 DIAGNOSIS — I495 Sick sinus syndrome: Secondary | ICD-10-CM | POA: Diagnosis not present

## 2017-01-10 DIAGNOSIS — I82A19 Acute embolism and thrombosis of unspecified axillary vein: Secondary | ICD-10-CM | POA: Diagnosis not present

## 2017-01-10 DIAGNOSIS — Z950 Presence of cardiac pacemaker: Secondary | ICD-10-CM | POA: Diagnosis not present

## 2017-01-10 DIAGNOSIS — I10 Essential (primary) hypertension: Secondary | ICD-10-CM | POA: Diagnosis not present

## 2017-01-10 DIAGNOSIS — E538 Deficiency of other specified B group vitamins: Secondary | ICD-10-CM | POA: Diagnosis not present

## 2017-01-10 DIAGNOSIS — I4891 Unspecified atrial fibrillation: Secondary | ICD-10-CM | POA: Diagnosis not present

## 2017-01-30 DIAGNOSIS — L603 Nail dystrophy: Secondary | ICD-10-CM | POA: Diagnosis not present

## 2017-01-30 DIAGNOSIS — E1151 Type 2 diabetes mellitus with diabetic peripheral angiopathy without gangrene: Secondary | ICD-10-CM | POA: Diagnosis not present

## 2017-01-30 DIAGNOSIS — L84 Corns and callosities: Secondary | ICD-10-CM | POA: Diagnosis not present

## 2017-02-27 DIAGNOSIS — E785 Hyperlipidemia, unspecified: Secondary | ICD-10-CM | POA: Diagnosis not present

## 2017-02-27 DIAGNOSIS — I10 Essential (primary) hypertension: Secondary | ICD-10-CM | POA: Diagnosis not present

## 2017-02-27 DIAGNOSIS — K5900 Constipation, unspecified: Secondary | ICD-10-CM | POA: Diagnosis not present

## 2017-02-27 DIAGNOSIS — G629 Polyneuropathy, unspecified: Secondary | ICD-10-CM | POA: Diagnosis not present

## 2017-02-27 DIAGNOSIS — Z79899 Other long term (current) drug therapy: Secondary | ICD-10-CM | POA: Diagnosis not present

## 2017-02-27 DIAGNOSIS — G8929 Other chronic pain: Secondary | ICD-10-CM | POA: Diagnosis not present

## 2017-02-27 DIAGNOSIS — R7303 Prediabetes: Secondary | ICD-10-CM | POA: Diagnosis not present

## 2017-02-27 DIAGNOSIS — N390 Urinary tract infection, site not specified: Secondary | ICD-10-CM | POA: Diagnosis not present

## 2017-03-08 DIAGNOSIS — E785 Hyperlipidemia, unspecified: Secondary | ICD-10-CM | POA: Diagnosis not present

## 2017-03-08 DIAGNOSIS — G9009 Other idiopathic peripheral autonomic neuropathy: Secondary | ICD-10-CM | POA: Diagnosis not present

## 2017-03-08 DIAGNOSIS — Z79899 Other long term (current) drug therapy: Secondary | ICD-10-CM | POA: Diagnosis not present

## 2017-03-08 DIAGNOSIS — I10 Essential (primary) hypertension: Secondary | ICD-10-CM | POA: Diagnosis not present

## 2017-03-08 DIAGNOSIS — K5900 Constipation, unspecified: Secondary | ICD-10-CM | POA: Diagnosis not present

## 2017-03-08 DIAGNOSIS — N390 Urinary tract infection, site not specified: Secondary | ICD-10-CM | POA: Diagnosis not present

## 2017-03-15 DIAGNOSIS — Z79899 Other long term (current) drug therapy: Secondary | ICD-10-CM | POA: Diagnosis not present

## 2017-03-15 DIAGNOSIS — G629 Polyneuropathy, unspecified: Secondary | ICD-10-CM | POA: Diagnosis not present

## 2017-03-15 DIAGNOSIS — F0280 Dementia in other diseases classified elsewhere without behavioral disturbance: Secondary | ICD-10-CM | POA: Diagnosis not present

## 2017-03-15 DIAGNOSIS — L209 Atopic dermatitis, unspecified: Secondary | ICD-10-CM | POA: Diagnosis not present

## 2017-03-22 DIAGNOSIS — L209 Atopic dermatitis, unspecified: Secondary | ICD-10-CM | POA: Diagnosis not present

## 2017-03-22 DIAGNOSIS — G8929 Other chronic pain: Secondary | ICD-10-CM | POA: Diagnosis not present

## 2017-03-22 DIAGNOSIS — F0280 Dementia in other diseases classified elsewhere without behavioral disturbance: Secondary | ICD-10-CM | POA: Diagnosis not present

## 2017-03-22 DIAGNOSIS — Z79899 Other long term (current) drug therapy: Secondary | ICD-10-CM | POA: Diagnosis not present

## 2017-03-22 DIAGNOSIS — G629 Polyneuropathy, unspecified: Secondary | ICD-10-CM | POA: Diagnosis not present

## 2017-03-29 DIAGNOSIS — R233 Spontaneous ecchymoses: Secondary | ICD-10-CM | POA: Diagnosis not present

## 2017-03-29 DIAGNOSIS — I10 Essential (primary) hypertension: Secondary | ICD-10-CM | POA: Diagnosis not present

## 2017-03-29 DIAGNOSIS — F0280 Dementia in other diseases classified elsewhere without behavioral disturbance: Secondary | ICD-10-CM | POA: Diagnosis not present

## 2017-03-29 DIAGNOSIS — G629 Polyneuropathy, unspecified: Secondary | ICD-10-CM | POA: Diagnosis not present

## 2017-03-29 DIAGNOSIS — G8929 Other chronic pain: Secondary | ICD-10-CM | POA: Diagnosis not present

## 2017-03-29 DIAGNOSIS — G9009 Other idiopathic peripheral autonomic neuropathy: Secondary | ICD-10-CM | POA: Diagnosis not present

## 2017-04-05 DIAGNOSIS — G8929 Other chronic pain: Secondary | ICD-10-CM | POA: Diagnosis not present

## 2017-04-05 DIAGNOSIS — I10 Essential (primary) hypertension: Secondary | ICD-10-CM | POA: Diagnosis not present

## 2017-04-05 DIAGNOSIS — G9009 Other idiopathic peripheral autonomic neuropathy: Secondary | ICD-10-CM | POA: Diagnosis not present

## 2017-04-10 DIAGNOSIS — I495 Sick sinus syndrome: Secondary | ICD-10-CM | POA: Diagnosis not present

## 2017-04-11 DIAGNOSIS — I495 Sick sinus syndrome: Secondary | ICD-10-CM | POA: Diagnosis not present

## 2017-04-12 DIAGNOSIS — S2191XA Laceration without foreign body of unspecified part of thorax, initial encounter: Secondary | ICD-10-CM | POA: Diagnosis not present

## 2017-04-12 DIAGNOSIS — I10 Essential (primary) hypertension: Secondary | ICD-10-CM | POA: Diagnosis not present

## 2017-04-12 DIAGNOSIS — Z79899 Other long term (current) drug therapy: Secondary | ICD-10-CM | POA: Diagnosis not present

## 2017-04-12 DIAGNOSIS — S20221A Contusion of right back wall of thorax, initial encounter: Secondary | ICD-10-CM | POA: Diagnosis not present

## 2017-04-13 DIAGNOSIS — G629 Polyneuropathy, unspecified: Secondary | ICD-10-CM | POA: Diagnosis not present

## 2017-04-13 DIAGNOSIS — I10 Essential (primary) hypertension: Secondary | ICD-10-CM | POA: Diagnosis not present

## 2017-04-13 DIAGNOSIS — E7800 Pure hypercholesterolemia, unspecified: Secondary | ICD-10-CM | POA: Diagnosis not present

## 2017-04-13 DIAGNOSIS — I4891 Unspecified atrial fibrillation: Secondary | ICD-10-CM | POA: Diagnosis not present

## 2017-04-14 DIAGNOSIS — R079 Chest pain, unspecified: Secondary | ICD-10-CM | POA: Diagnosis not present

## 2017-04-14 DIAGNOSIS — W19XXXA Unspecified fall, initial encounter: Secondary | ICD-10-CM | POA: Diagnosis not present

## 2017-04-16 DIAGNOSIS — H5213 Myopia, bilateral: Secondary | ICD-10-CM | POA: Diagnosis not present

## 2017-04-16 DIAGNOSIS — H401134 Primary open-angle glaucoma, bilateral, indeterminate stage: Secondary | ICD-10-CM | POA: Diagnosis not present

## 2017-04-16 DIAGNOSIS — I639 Cerebral infarction, unspecified: Secondary | ICD-10-CM | POA: Diagnosis not present

## 2017-04-16 DIAGNOSIS — H353191 Nonexudative age-related macular degeneration, unspecified eye, early dry stage: Secondary | ICD-10-CM | POA: Diagnosis not present

## 2017-04-19 DIAGNOSIS — G9009 Other idiopathic peripheral autonomic neuropathy: Secondary | ICD-10-CM | POA: Diagnosis not present

## 2017-04-19 DIAGNOSIS — R233 Spontaneous ecchymoses: Secondary | ICD-10-CM | POA: Diagnosis not present

## 2017-04-19 DIAGNOSIS — S29029D Laceration of muscle and tendon of unspecified wall of thorax, subsequent encounter: Secondary | ICD-10-CM | POA: Diagnosis not present

## 2017-04-19 DIAGNOSIS — S20221D Contusion of right back wall of thorax, subsequent encounter: Secondary | ICD-10-CM | POA: Diagnosis not present

## 2017-04-19 DIAGNOSIS — I10 Essential (primary) hypertension: Secondary | ICD-10-CM | POA: Diagnosis not present

## 2017-04-24 DIAGNOSIS — N390 Urinary tract infection, site not specified: Secondary | ICD-10-CM | POA: Diagnosis not present

## 2017-04-26 DIAGNOSIS — Z79899 Other long term (current) drug therapy: Secondary | ICD-10-CM | POA: Diagnosis not present

## 2017-04-26 DIAGNOSIS — I10 Essential (primary) hypertension: Secondary | ICD-10-CM | POA: Diagnosis not present

## 2017-04-26 DIAGNOSIS — S29029D Laceration of muscle and tendon of unspecified wall of thorax, subsequent encounter: Secondary | ICD-10-CM | POA: Diagnosis not present

## 2017-05-01 DIAGNOSIS — N390 Urinary tract infection, site not specified: Secondary | ICD-10-CM | POA: Diagnosis not present

## 2017-05-04 DIAGNOSIS — G629 Polyneuropathy, unspecified: Secondary | ICD-10-CM | POA: Diagnosis not present

## 2017-05-04 DIAGNOSIS — N390 Urinary tract infection, site not specified: Secondary | ICD-10-CM | POA: Diagnosis not present

## 2017-05-04 DIAGNOSIS — I4891 Unspecified atrial fibrillation: Secondary | ICD-10-CM | POA: Diagnosis not present

## 2017-05-04 DIAGNOSIS — I10 Essential (primary) hypertension: Secondary | ICD-10-CM | POA: Diagnosis not present

## 2017-05-09 DIAGNOSIS — G629 Polyneuropathy, unspecified: Secondary | ICD-10-CM | POA: Diagnosis not present

## 2017-05-09 DIAGNOSIS — M6281 Muscle weakness (generalized): Secondary | ICD-10-CM | POA: Diagnosis not present

## 2017-05-09 DIAGNOSIS — I10 Essential (primary) hypertension: Secondary | ICD-10-CM | POA: Diagnosis not present

## 2017-05-09 DIAGNOSIS — F0280 Dementia in other diseases classified elsewhere without behavioral disturbance: Secondary | ICD-10-CM | POA: Diagnosis not present

## 2017-05-10 DIAGNOSIS — H3530 Unspecified macular degeneration: Secondary | ICD-10-CM | POA: Diagnosis not present

## 2017-05-10 DIAGNOSIS — M6281 Muscle weakness (generalized): Secondary | ICD-10-CM | POA: Diagnosis not present

## 2017-05-10 DIAGNOSIS — I69398 Other sequelae of cerebral infarction: Secondary | ICD-10-CM | POA: Diagnosis not present

## 2017-05-10 DIAGNOSIS — Z950 Presence of cardiac pacemaker: Secondary | ICD-10-CM | POA: Diagnosis not present

## 2017-05-10 DIAGNOSIS — I10 Essential (primary) hypertension: Secondary | ICD-10-CM | POA: Diagnosis not present

## 2017-05-10 DIAGNOSIS — R262 Difficulty in walking, not elsewhere classified: Secondary | ICD-10-CM | POA: Diagnosis not present

## 2017-05-10 DIAGNOSIS — M1990 Unspecified osteoarthritis, unspecified site: Secondary | ICD-10-CM | POA: Diagnosis not present

## 2017-05-10 DIAGNOSIS — Z9181 History of falling: Secondary | ICD-10-CM | POA: Diagnosis not present

## 2017-05-10 DIAGNOSIS — G629 Polyneuropathy, unspecified: Secondary | ICD-10-CM | POA: Diagnosis not present

## 2017-05-15 DIAGNOSIS — L603 Nail dystrophy: Secondary | ICD-10-CM | POA: Diagnosis not present

## 2017-05-15 DIAGNOSIS — L84 Corns and callosities: Secondary | ICD-10-CM | POA: Diagnosis not present

## 2017-05-15 DIAGNOSIS — E1151 Type 2 diabetes mellitus with diabetic peripheral angiopathy without gangrene: Secondary | ICD-10-CM | POA: Diagnosis not present

## 2017-05-16 DIAGNOSIS — M6281 Muscle weakness (generalized): Secondary | ICD-10-CM | POA: Diagnosis not present

## 2017-05-16 DIAGNOSIS — M1990 Unspecified osteoarthritis, unspecified site: Secondary | ICD-10-CM | POA: Diagnosis not present

## 2017-05-16 DIAGNOSIS — I10 Essential (primary) hypertension: Secondary | ICD-10-CM | POA: Diagnosis not present

## 2017-05-16 DIAGNOSIS — I69398 Other sequelae of cerebral infarction: Secondary | ICD-10-CM | POA: Diagnosis not present

## 2017-05-16 DIAGNOSIS — H3530 Unspecified macular degeneration: Secondary | ICD-10-CM | POA: Diagnosis not present

## 2017-05-16 DIAGNOSIS — G629 Polyneuropathy, unspecified: Secondary | ICD-10-CM | POA: Diagnosis not present

## 2017-05-17 DIAGNOSIS — I10 Essential (primary) hypertension: Secondary | ICD-10-CM | POA: Diagnosis not present

## 2017-05-17 DIAGNOSIS — M6281 Muscle weakness (generalized): Secondary | ICD-10-CM | POA: Diagnosis not present

## 2017-05-17 DIAGNOSIS — F0280 Dementia in other diseases classified elsewhere without behavioral disturbance: Secondary | ICD-10-CM | POA: Diagnosis not present

## 2017-05-17 DIAGNOSIS — G629 Polyneuropathy, unspecified: Secondary | ICD-10-CM | POA: Diagnosis not present

## 2017-05-17 DIAGNOSIS — L209 Atopic dermatitis, unspecified: Secondary | ICD-10-CM | POA: Diagnosis not present

## 2017-05-18 DIAGNOSIS — G629 Polyneuropathy, unspecified: Secondary | ICD-10-CM | POA: Diagnosis not present

## 2017-05-18 DIAGNOSIS — I10 Essential (primary) hypertension: Secondary | ICD-10-CM | POA: Diagnosis not present

## 2017-05-18 DIAGNOSIS — H3530 Unspecified macular degeneration: Secondary | ICD-10-CM | POA: Diagnosis not present

## 2017-05-18 DIAGNOSIS — I69398 Other sequelae of cerebral infarction: Secondary | ICD-10-CM | POA: Diagnosis not present

## 2017-05-18 DIAGNOSIS — M6281 Muscle weakness (generalized): Secondary | ICD-10-CM | POA: Diagnosis not present

## 2017-05-18 DIAGNOSIS — M1990 Unspecified osteoarthritis, unspecified site: Secondary | ICD-10-CM | POA: Diagnosis not present

## 2017-05-22 DIAGNOSIS — F0280 Dementia in other diseases classified elsewhere without behavioral disturbance: Secondary | ICD-10-CM | POA: Diagnosis not present

## 2017-05-22 DIAGNOSIS — I10 Essential (primary) hypertension: Secondary | ICD-10-CM | POA: Diagnosis not present

## 2017-05-22 DIAGNOSIS — R030 Elevated blood-pressure reading, without diagnosis of hypertension: Secondary | ICD-10-CM | POA: Diagnosis not present

## 2017-05-22 DIAGNOSIS — N390 Urinary tract infection, site not specified: Secondary | ICD-10-CM | POA: Diagnosis not present

## 2017-05-22 DIAGNOSIS — Z79899 Other long term (current) drug therapy: Secondary | ICD-10-CM | POA: Diagnosis not present

## 2017-05-22 DIAGNOSIS — M6281 Muscle weakness (generalized): Secondary | ICD-10-CM | POA: Diagnosis not present

## 2017-05-22 DIAGNOSIS — R350 Frequency of micturition: Secondary | ICD-10-CM | POA: Diagnosis not present

## 2017-05-22 DIAGNOSIS — G8929 Other chronic pain: Secondary | ICD-10-CM | POA: Diagnosis not present

## 2017-05-23 DIAGNOSIS — I69398 Other sequelae of cerebral infarction: Secondary | ICD-10-CM | POA: Diagnosis not present

## 2017-05-23 DIAGNOSIS — I10 Essential (primary) hypertension: Secondary | ICD-10-CM | POA: Diagnosis not present

## 2017-05-23 DIAGNOSIS — M6281 Muscle weakness (generalized): Secondary | ICD-10-CM | POA: Diagnosis not present

## 2017-05-23 DIAGNOSIS — G629 Polyneuropathy, unspecified: Secondary | ICD-10-CM | POA: Diagnosis not present

## 2017-05-23 DIAGNOSIS — M1990 Unspecified osteoarthritis, unspecified site: Secondary | ICD-10-CM | POA: Diagnosis not present

## 2017-05-23 DIAGNOSIS — H3530 Unspecified macular degeneration: Secondary | ICD-10-CM | POA: Diagnosis not present

## 2017-05-24 DIAGNOSIS — L209 Atopic dermatitis, unspecified: Secondary | ICD-10-CM | POA: Diagnosis not present

## 2017-05-24 DIAGNOSIS — I10 Essential (primary) hypertension: Secondary | ICD-10-CM | POA: Diagnosis not present

## 2017-05-24 DIAGNOSIS — G629 Polyneuropathy, unspecified: Secondary | ICD-10-CM | POA: Diagnosis not present

## 2017-05-24 DIAGNOSIS — N390 Urinary tract infection, site not specified: Secondary | ICD-10-CM | POA: Diagnosis not present

## 2017-05-24 DIAGNOSIS — M6281 Muscle weakness (generalized): Secondary | ICD-10-CM | POA: Diagnosis not present

## 2017-05-24 DIAGNOSIS — F0280 Dementia in other diseases classified elsewhere without behavioral disturbance: Secondary | ICD-10-CM | POA: Diagnosis not present

## 2017-05-25 DIAGNOSIS — M1990 Unspecified osteoarthritis, unspecified site: Secondary | ICD-10-CM | POA: Diagnosis not present

## 2017-05-25 DIAGNOSIS — H3530 Unspecified macular degeneration: Secondary | ICD-10-CM | POA: Diagnosis not present

## 2017-05-25 DIAGNOSIS — I69398 Other sequelae of cerebral infarction: Secondary | ICD-10-CM | POA: Diagnosis not present

## 2017-05-25 DIAGNOSIS — M6281 Muscle weakness (generalized): Secondary | ICD-10-CM | POA: Diagnosis not present

## 2017-05-25 DIAGNOSIS — G629 Polyneuropathy, unspecified: Secondary | ICD-10-CM | POA: Diagnosis not present

## 2017-05-25 DIAGNOSIS — I10 Essential (primary) hypertension: Secondary | ICD-10-CM | POA: Diagnosis not present

## 2017-05-30 DIAGNOSIS — G629 Polyneuropathy, unspecified: Secondary | ICD-10-CM | POA: Diagnosis not present

## 2017-05-30 DIAGNOSIS — M1990 Unspecified osteoarthritis, unspecified site: Secondary | ICD-10-CM | POA: Diagnosis not present

## 2017-05-30 DIAGNOSIS — M6281 Muscle weakness (generalized): Secondary | ICD-10-CM | POA: Diagnosis not present

## 2017-05-30 DIAGNOSIS — H3530 Unspecified macular degeneration: Secondary | ICD-10-CM | POA: Diagnosis not present

## 2017-05-30 DIAGNOSIS — I10 Essential (primary) hypertension: Secondary | ICD-10-CM | POA: Diagnosis not present

## 2017-05-30 DIAGNOSIS — I69398 Other sequelae of cerebral infarction: Secondary | ICD-10-CM | POA: Diagnosis not present

## 2017-06-01 DIAGNOSIS — I10 Essential (primary) hypertension: Secondary | ICD-10-CM | POA: Diagnosis not present

## 2017-06-01 DIAGNOSIS — L209 Atopic dermatitis, unspecified: Secondary | ICD-10-CM | POA: Diagnosis not present

## 2017-06-01 DIAGNOSIS — H3530 Unspecified macular degeneration: Secondary | ICD-10-CM | POA: Diagnosis not present

## 2017-06-01 DIAGNOSIS — I69398 Other sequelae of cerebral infarction: Secondary | ICD-10-CM | POA: Diagnosis not present

## 2017-06-01 DIAGNOSIS — G629 Polyneuropathy, unspecified: Secondary | ICD-10-CM | POA: Diagnosis not present

## 2017-06-01 DIAGNOSIS — M1990 Unspecified osteoarthritis, unspecified site: Secondary | ICD-10-CM | POA: Diagnosis not present

## 2017-06-01 DIAGNOSIS — M6281 Muscle weakness (generalized): Secondary | ICD-10-CM | POA: Diagnosis not present

## 2017-06-01 DIAGNOSIS — F0280 Dementia in other diseases classified elsewhere without behavioral disturbance: Secondary | ICD-10-CM | POA: Diagnosis not present

## 2017-06-06 DIAGNOSIS — I69398 Other sequelae of cerebral infarction: Secondary | ICD-10-CM | POA: Diagnosis not present

## 2017-06-06 DIAGNOSIS — M1990 Unspecified osteoarthritis, unspecified site: Secondary | ICD-10-CM | POA: Diagnosis not present

## 2017-06-06 DIAGNOSIS — M6281 Muscle weakness (generalized): Secondary | ICD-10-CM | POA: Diagnosis not present

## 2017-06-06 DIAGNOSIS — I10 Essential (primary) hypertension: Secondary | ICD-10-CM | POA: Diagnosis not present

## 2017-06-06 DIAGNOSIS — G629 Polyneuropathy, unspecified: Secondary | ICD-10-CM | POA: Diagnosis not present

## 2017-06-06 DIAGNOSIS — H3530 Unspecified macular degeneration: Secondary | ICD-10-CM | POA: Diagnosis not present

## 2017-06-08 DIAGNOSIS — M1990 Unspecified osteoarthritis, unspecified site: Secondary | ICD-10-CM | POA: Diagnosis not present

## 2017-06-08 DIAGNOSIS — I10 Essential (primary) hypertension: Secondary | ICD-10-CM | POA: Diagnosis not present

## 2017-06-08 DIAGNOSIS — I69398 Other sequelae of cerebral infarction: Secondary | ICD-10-CM | POA: Diagnosis not present

## 2017-06-08 DIAGNOSIS — H3530 Unspecified macular degeneration: Secondary | ICD-10-CM | POA: Diagnosis not present

## 2017-06-08 DIAGNOSIS — M6281 Muscle weakness (generalized): Secondary | ICD-10-CM | POA: Diagnosis not present

## 2017-06-08 DIAGNOSIS — G629 Polyneuropathy, unspecified: Secondary | ICD-10-CM | POA: Diagnosis not present

## 2017-06-12 DIAGNOSIS — G629 Polyneuropathy, unspecified: Secondary | ICD-10-CM | POA: Diagnosis not present

## 2017-06-12 DIAGNOSIS — I10 Essential (primary) hypertension: Secondary | ICD-10-CM | POA: Diagnosis not present

## 2017-06-12 DIAGNOSIS — M6281 Muscle weakness (generalized): Secondary | ICD-10-CM | POA: Diagnosis not present

## 2017-06-12 DIAGNOSIS — M1990 Unspecified osteoarthritis, unspecified site: Secondary | ICD-10-CM | POA: Diagnosis not present

## 2017-06-12 DIAGNOSIS — H3530 Unspecified macular degeneration: Secondary | ICD-10-CM | POA: Diagnosis not present

## 2017-06-12 DIAGNOSIS — I69398 Other sequelae of cerebral infarction: Secondary | ICD-10-CM | POA: Diagnosis not present

## 2017-06-13 DIAGNOSIS — Z23 Encounter for immunization: Secondary | ICD-10-CM | POA: Diagnosis not present

## 2017-06-13 DIAGNOSIS — G629 Polyneuropathy, unspecified: Secondary | ICD-10-CM | POA: Diagnosis not present

## 2017-06-13 DIAGNOSIS — I69398 Other sequelae of cerebral infarction: Secondary | ICD-10-CM | POA: Diagnosis not present

## 2017-06-13 DIAGNOSIS — M6281 Muscle weakness (generalized): Secondary | ICD-10-CM | POA: Diagnosis not present

## 2017-06-13 DIAGNOSIS — M1990 Unspecified osteoarthritis, unspecified site: Secondary | ICD-10-CM | POA: Diagnosis not present

## 2017-06-13 DIAGNOSIS — I10 Essential (primary) hypertension: Secondary | ICD-10-CM | POA: Diagnosis not present

## 2017-06-13 DIAGNOSIS — H3530 Unspecified macular degeneration: Secondary | ICD-10-CM | POA: Diagnosis not present

## 2017-06-14 DIAGNOSIS — F0280 Dementia in other diseases classified elsewhere without behavioral disturbance: Secondary | ICD-10-CM | POA: Diagnosis not present

## 2017-06-14 DIAGNOSIS — I10 Essential (primary) hypertension: Secondary | ICD-10-CM | POA: Diagnosis not present

## 2017-06-14 DIAGNOSIS — G629 Polyneuropathy, unspecified: Secondary | ICD-10-CM | POA: Diagnosis not present

## 2017-06-14 DIAGNOSIS — L209 Atopic dermatitis, unspecified: Secondary | ICD-10-CM | POA: Diagnosis not present

## 2017-06-14 DIAGNOSIS — M6281 Muscle weakness (generalized): Secondary | ICD-10-CM | POA: Diagnosis not present

## 2017-06-15 DIAGNOSIS — G629 Polyneuropathy, unspecified: Secondary | ICD-10-CM | POA: Diagnosis not present

## 2017-06-15 DIAGNOSIS — I10 Essential (primary) hypertension: Secondary | ICD-10-CM | POA: Diagnosis not present

## 2017-06-15 DIAGNOSIS — N390 Urinary tract infection, site not specified: Secondary | ICD-10-CM | POA: Diagnosis not present

## 2017-06-15 DIAGNOSIS — M6281 Muscle weakness (generalized): Secondary | ICD-10-CM | POA: Diagnosis not present

## 2017-06-15 DIAGNOSIS — M1990 Unspecified osteoarthritis, unspecified site: Secondary | ICD-10-CM | POA: Diagnosis not present

## 2017-06-15 DIAGNOSIS — H3530 Unspecified macular degeneration: Secondary | ICD-10-CM | POA: Diagnosis not present

## 2017-06-15 DIAGNOSIS — I69398 Other sequelae of cerebral infarction: Secondary | ICD-10-CM | POA: Diagnosis not present

## 2017-06-19 DIAGNOSIS — I69398 Other sequelae of cerebral infarction: Secondary | ICD-10-CM | POA: Diagnosis not present

## 2017-06-19 DIAGNOSIS — H3530 Unspecified macular degeneration: Secondary | ICD-10-CM | POA: Diagnosis not present

## 2017-06-19 DIAGNOSIS — G629 Polyneuropathy, unspecified: Secondary | ICD-10-CM | POA: Diagnosis not present

## 2017-06-19 DIAGNOSIS — M1990 Unspecified osteoarthritis, unspecified site: Secondary | ICD-10-CM | POA: Diagnosis not present

## 2017-06-19 DIAGNOSIS — M6281 Muscle weakness (generalized): Secondary | ICD-10-CM | POA: Diagnosis not present

## 2017-06-19 DIAGNOSIS — I10 Essential (primary) hypertension: Secondary | ICD-10-CM | POA: Diagnosis not present

## 2017-06-20 DIAGNOSIS — M6281 Muscle weakness (generalized): Secondary | ICD-10-CM | POA: Diagnosis not present

## 2017-06-20 DIAGNOSIS — I10 Essential (primary) hypertension: Secondary | ICD-10-CM | POA: Diagnosis not present

## 2017-06-20 DIAGNOSIS — H3530 Unspecified macular degeneration: Secondary | ICD-10-CM | POA: Diagnosis not present

## 2017-06-20 DIAGNOSIS — G629 Polyneuropathy, unspecified: Secondary | ICD-10-CM | POA: Diagnosis not present

## 2017-06-20 DIAGNOSIS — I69398 Other sequelae of cerebral infarction: Secondary | ICD-10-CM | POA: Diagnosis not present

## 2017-06-20 DIAGNOSIS — M1990 Unspecified osteoarthritis, unspecified site: Secondary | ICD-10-CM | POA: Diagnosis not present

## 2017-06-21 DIAGNOSIS — M6281 Muscle weakness (generalized): Secondary | ICD-10-CM | POA: Diagnosis not present

## 2017-06-21 DIAGNOSIS — I69398 Other sequelae of cerebral infarction: Secondary | ICD-10-CM | POA: Diagnosis not present

## 2017-06-21 DIAGNOSIS — G629 Polyneuropathy, unspecified: Secondary | ICD-10-CM | POA: Diagnosis not present

## 2017-06-21 DIAGNOSIS — M1990 Unspecified osteoarthritis, unspecified site: Secondary | ICD-10-CM | POA: Diagnosis not present

## 2017-06-21 DIAGNOSIS — I10 Essential (primary) hypertension: Secondary | ICD-10-CM | POA: Diagnosis not present

## 2017-06-21 DIAGNOSIS — H3530 Unspecified macular degeneration: Secondary | ICD-10-CM | POA: Diagnosis not present

## 2017-06-22 DIAGNOSIS — H3530 Unspecified macular degeneration: Secondary | ICD-10-CM | POA: Diagnosis not present

## 2017-06-22 DIAGNOSIS — M1990 Unspecified osteoarthritis, unspecified site: Secondary | ICD-10-CM | POA: Diagnosis not present

## 2017-06-22 DIAGNOSIS — G629 Polyneuropathy, unspecified: Secondary | ICD-10-CM | POA: Diagnosis not present

## 2017-06-22 DIAGNOSIS — M6281 Muscle weakness (generalized): Secondary | ICD-10-CM | POA: Diagnosis not present

## 2017-06-22 DIAGNOSIS — I69398 Other sequelae of cerebral infarction: Secondary | ICD-10-CM | POA: Diagnosis not present

## 2017-06-22 DIAGNOSIS — I10 Essential (primary) hypertension: Secondary | ICD-10-CM | POA: Diagnosis not present

## 2017-06-25 DIAGNOSIS — I10 Essential (primary) hypertension: Secondary | ICD-10-CM | POA: Diagnosis not present

## 2017-06-25 DIAGNOSIS — H3530 Unspecified macular degeneration: Secondary | ICD-10-CM | POA: Diagnosis not present

## 2017-06-25 DIAGNOSIS — G629 Polyneuropathy, unspecified: Secondary | ICD-10-CM | POA: Diagnosis not present

## 2017-06-25 DIAGNOSIS — M6281 Muscle weakness (generalized): Secondary | ICD-10-CM | POA: Diagnosis not present

## 2017-06-25 DIAGNOSIS — M1990 Unspecified osteoarthritis, unspecified site: Secondary | ICD-10-CM | POA: Diagnosis not present

## 2017-06-25 DIAGNOSIS — I69398 Other sequelae of cerebral infarction: Secondary | ICD-10-CM | POA: Diagnosis not present

## 2017-06-27 DIAGNOSIS — M1990 Unspecified osteoarthritis, unspecified site: Secondary | ICD-10-CM | POA: Diagnosis not present

## 2017-06-27 DIAGNOSIS — G629 Polyneuropathy, unspecified: Secondary | ICD-10-CM | POA: Diagnosis not present

## 2017-06-27 DIAGNOSIS — I69398 Other sequelae of cerebral infarction: Secondary | ICD-10-CM | POA: Diagnosis not present

## 2017-06-27 DIAGNOSIS — M6281 Muscle weakness (generalized): Secondary | ICD-10-CM | POA: Diagnosis not present

## 2017-06-27 DIAGNOSIS — H3530 Unspecified macular degeneration: Secondary | ICD-10-CM | POA: Diagnosis not present

## 2017-06-27 DIAGNOSIS — I10 Essential (primary) hypertension: Secondary | ICD-10-CM | POA: Diagnosis not present

## 2017-06-28 DIAGNOSIS — I69398 Other sequelae of cerebral infarction: Secondary | ICD-10-CM | POA: Diagnosis not present

## 2017-06-28 DIAGNOSIS — I10 Essential (primary) hypertension: Secondary | ICD-10-CM | POA: Diagnosis not present

## 2017-06-28 DIAGNOSIS — G629 Polyneuropathy, unspecified: Secondary | ICD-10-CM | POA: Diagnosis not present

## 2017-06-28 DIAGNOSIS — H3530 Unspecified macular degeneration: Secondary | ICD-10-CM | POA: Diagnosis not present

## 2017-06-28 DIAGNOSIS — L209 Atopic dermatitis, unspecified: Secondary | ICD-10-CM | POA: Diagnosis not present

## 2017-06-28 DIAGNOSIS — F0280 Dementia in other diseases classified elsewhere without behavioral disturbance: Secondary | ICD-10-CM | POA: Diagnosis not present

## 2017-06-28 DIAGNOSIS — M1990 Unspecified osteoarthritis, unspecified site: Secondary | ICD-10-CM | POA: Diagnosis not present

## 2017-06-28 DIAGNOSIS — M6281 Muscle weakness (generalized): Secondary | ICD-10-CM | POA: Diagnosis not present

## 2017-06-29 DIAGNOSIS — G629 Polyneuropathy, unspecified: Secondary | ICD-10-CM | POA: Diagnosis not present

## 2017-06-29 DIAGNOSIS — H3530 Unspecified macular degeneration: Secondary | ICD-10-CM | POA: Diagnosis not present

## 2017-06-29 DIAGNOSIS — M6281 Muscle weakness (generalized): Secondary | ICD-10-CM | POA: Diagnosis not present

## 2017-06-29 DIAGNOSIS — M1990 Unspecified osteoarthritis, unspecified site: Secondary | ICD-10-CM | POA: Diagnosis not present

## 2017-06-29 DIAGNOSIS — I69398 Other sequelae of cerebral infarction: Secondary | ICD-10-CM | POA: Diagnosis not present

## 2017-06-29 DIAGNOSIS — I10 Essential (primary) hypertension: Secondary | ICD-10-CM | POA: Diagnosis not present

## 2017-07-03 DIAGNOSIS — H3530 Unspecified macular degeneration: Secondary | ICD-10-CM | POA: Diagnosis not present

## 2017-07-03 DIAGNOSIS — I10 Essential (primary) hypertension: Secondary | ICD-10-CM | POA: Diagnosis not present

## 2017-07-03 DIAGNOSIS — I69398 Other sequelae of cerebral infarction: Secondary | ICD-10-CM | POA: Diagnosis not present

## 2017-07-03 DIAGNOSIS — M6281 Muscle weakness (generalized): Secondary | ICD-10-CM | POA: Diagnosis not present

## 2017-07-03 DIAGNOSIS — M1990 Unspecified osteoarthritis, unspecified site: Secondary | ICD-10-CM | POA: Diagnosis not present

## 2017-07-03 DIAGNOSIS — G629 Polyneuropathy, unspecified: Secondary | ICD-10-CM | POA: Diagnosis not present

## 2017-07-05 DIAGNOSIS — G629 Polyneuropathy, unspecified: Secondary | ICD-10-CM | POA: Diagnosis not present

## 2017-07-05 DIAGNOSIS — M9906 Segmental and somatic dysfunction of lower extremity: Secondary | ICD-10-CM | POA: Diagnosis not present

## 2017-07-05 DIAGNOSIS — H3530 Unspecified macular degeneration: Secondary | ICD-10-CM | POA: Diagnosis not present

## 2017-07-05 DIAGNOSIS — F0280 Dementia in other diseases classified elsewhere without behavioral disturbance: Secondary | ICD-10-CM | POA: Diagnosis not present

## 2017-07-05 DIAGNOSIS — M9905 Segmental and somatic dysfunction of pelvic region: Secondary | ICD-10-CM | POA: Diagnosis not present

## 2017-07-05 DIAGNOSIS — I69398 Other sequelae of cerebral infarction: Secondary | ICD-10-CM | POA: Diagnosis not present

## 2017-07-05 DIAGNOSIS — I10 Essential (primary) hypertension: Secondary | ICD-10-CM | POA: Diagnosis not present

## 2017-07-05 DIAGNOSIS — M1990 Unspecified osteoarthritis, unspecified site: Secondary | ICD-10-CM | POA: Diagnosis not present

## 2017-07-05 DIAGNOSIS — M6281 Muscle weakness (generalized): Secondary | ICD-10-CM | POA: Diagnosis not present

## 2017-07-12 DIAGNOSIS — F0280 Dementia in other diseases classified elsewhere without behavioral disturbance: Secondary | ICD-10-CM | POA: Diagnosis not present

## 2017-07-12 DIAGNOSIS — I10 Essential (primary) hypertension: Secondary | ICD-10-CM | POA: Diagnosis not present

## 2017-07-12 DIAGNOSIS — M9905 Segmental and somatic dysfunction of pelvic region: Secondary | ICD-10-CM | POA: Diagnosis not present

## 2017-07-12 DIAGNOSIS — G629 Polyneuropathy, unspecified: Secondary | ICD-10-CM | POA: Diagnosis not present

## 2017-07-12 DIAGNOSIS — M6281 Muscle weakness (generalized): Secondary | ICD-10-CM | POA: Diagnosis not present

## 2017-07-12 DIAGNOSIS — M9906 Segmental and somatic dysfunction of lower extremity: Secondary | ICD-10-CM | POA: Diagnosis not present

## 2017-07-17 DIAGNOSIS — G629 Polyneuropathy, unspecified: Secondary | ICD-10-CM | POA: Diagnosis not present

## 2017-07-17 DIAGNOSIS — Z79899 Other long term (current) drug therapy: Secondary | ICD-10-CM | POA: Diagnosis not present

## 2017-07-17 DIAGNOSIS — I10 Essential (primary) hypertension: Secondary | ICD-10-CM | POA: Diagnosis not present

## 2017-07-17 DIAGNOSIS — E7800 Pure hypercholesterolemia, unspecified: Secondary | ICD-10-CM | POA: Diagnosis not present

## 2017-07-24 DIAGNOSIS — I495 Sick sinus syndrome: Secondary | ICD-10-CM | POA: Diagnosis not present

## 2017-07-25 DIAGNOSIS — I495 Sick sinus syndrome: Secondary | ICD-10-CM | POA: Diagnosis not present

## 2017-07-26 DIAGNOSIS — M6281 Muscle weakness (generalized): Secondary | ICD-10-CM | POA: Diagnosis not present

## 2017-07-26 DIAGNOSIS — I10 Essential (primary) hypertension: Secondary | ICD-10-CM | POA: Diagnosis not present

## 2017-07-26 DIAGNOSIS — M9905 Segmental and somatic dysfunction of pelvic region: Secondary | ICD-10-CM | POA: Diagnosis not present

## 2017-07-26 DIAGNOSIS — F0280 Dementia in other diseases classified elsewhere without behavioral disturbance: Secondary | ICD-10-CM | POA: Diagnosis not present

## 2017-07-26 DIAGNOSIS — G629 Polyneuropathy, unspecified: Secondary | ICD-10-CM | POA: Diagnosis not present

## 2017-07-26 DIAGNOSIS — M9906 Segmental and somatic dysfunction of lower extremity: Secondary | ICD-10-CM | POA: Diagnosis not present

## 2017-08-09 DIAGNOSIS — M9905 Segmental and somatic dysfunction of pelvic region: Secondary | ICD-10-CM | POA: Diagnosis not present

## 2017-08-09 DIAGNOSIS — I10 Essential (primary) hypertension: Secondary | ICD-10-CM | POA: Diagnosis not present

## 2017-08-09 DIAGNOSIS — G629 Polyneuropathy, unspecified: Secondary | ICD-10-CM | POA: Diagnosis not present

## 2017-08-09 DIAGNOSIS — M6281 Muscle weakness (generalized): Secondary | ICD-10-CM | POA: Diagnosis not present

## 2017-08-09 DIAGNOSIS — F0280 Dementia in other diseases classified elsewhere without behavioral disturbance: Secondary | ICD-10-CM | POA: Diagnosis not present

## 2017-08-09 DIAGNOSIS — M9906 Segmental and somatic dysfunction of lower extremity: Secondary | ICD-10-CM | POA: Diagnosis not present

## 2017-08-09 DIAGNOSIS — R609 Edema, unspecified: Secondary | ICD-10-CM | POA: Diagnosis not present

## 2017-08-14 DIAGNOSIS — E1151 Type 2 diabetes mellitus with diabetic peripheral angiopathy without gangrene: Secondary | ICD-10-CM | POA: Diagnosis not present

## 2017-08-14 DIAGNOSIS — L603 Nail dystrophy: Secondary | ICD-10-CM | POA: Diagnosis not present

## 2017-08-14 DIAGNOSIS — L84 Corns and callosities: Secondary | ICD-10-CM | POA: Diagnosis not present

## 2017-08-16 DIAGNOSIS — G629 Polyneuropathy, unspecified: Secondary | ICD-10-CM | POA: Diagnosis not present

## 2017-08-16 DIAGNOSIS — R609 Edema, unspecified: Secondary | ICD-10-CM | POA: Diagnosis not present

## 2017-08-16 DIAGNOSIS — I10 Essential (primary) hypertension: Secondary | ICD-10-CM | POA: Diagnosis not present

## 2017-08-16 DIAGNOSIS — M9906 Segmental and somatic dysfunction of lower extremity: Secondary | ICD-10-CM | POA: Diagnosis not present

## 2017-08-16 DIAGNOSIS — Z79899 Other long term (current) drug therapy: Secondary | ICD-10-CM | POA: Diagnosis not present

## 2017-08-16 DIAGNOSIS — M9905 Segmental and somatic dysfunction of pelvic region: Secondary | ICD-10-CM | POA: Diagnosis not present

## 2017-08-16 DIAGNOSIS — F0280 Dementia in other diseases classified elsewhere without behavioral disturbance: Secondary | ICD-10-CM | POA: Diagnosis not present

## 2017-08-16 DIAGNOSIS — M6281 Muscle weakness (generalized): Secondary | ICD-10-CM | POA: Diagnosis not present

## 2020-06-28 DEATH — deceased
